# Patient Record
Sex: Female | Born: 1965 | Race: White | Hispanic: No | Marital: Single | Smoking: Current some day smoker
Health system: Southern US, Community
[De-identification: ages and names within clinical notes are randomized; demographics above are authoritative.]

## PROBLEM LIST (undated history)

## (undated) DIAGNOSIS — J439 Emphysema, unspecified: Secondary | ICD-10-CM

## (undated) DIAGNOSIS — G43909 Migraine, unspecified, not intractable, without status migrainosus: Secondary | ICD-10-CM

## (undated) DIAGNOSIS — J449 Chronic obstructive pulmonary disease, unspecified: Secondary | ICD-10-CM

## (undated) DIAGNOSIS — C801 Malignant (primary) neoplasm, unspecified: Secondary | ICD-10-CM

## (undated) DIAGNOSIS — F4310 Post-traumatic stress disorder, unspecified: Secondary | ICD-10-CM

## (undated) DIAGNOSIS — I7090 Unspecified atherosclerosis: Secondary | ICD-10-CM

## (undated) HISTORY — PX: OTHER SURGICAL HISTORY: SHX169

## (undated) HISTORY — PX: TUBAL LIGATION: SHX77

## (undated) HISTORY — DX: Chronic obstructive pulmonary disease, unspecified: J44.9

---

## 2002-08-04 ENCOUNTER — Encounter: Admission: RE | Admit: 2002-08-04 | Discharge: 2002-08-04 | Payer: Self-pay | Admitting: Internal Medicine

## 2002-08-04 ENCOUNTER — Encounter: Payer: Self-pay | Admitting: Internal Medicine

## 2004-02-07 ENCOUNTER — Other Ambulatory Visit: Payer: Self-pay

## 2004-02-12 ENCOUNTER — Other Ambulatory Visit: Payer: Self-pay

## 2004-05-17 ENCOUNTER — Emergency Department: Payer: Self-pay | Admitting: Emergency Medicine

## 2005-08-07 ENCOUNTER — Other Ambulatory Visit: Payer: Self-pay

## 2005-08-07 ENCOUNTER — Emergency Department: Payer: Self-pay | Admitting: Emergency Medicine

## 2005-12-10 ENCOUNTER — Emergency Department: Payer: Self-pay | Admitting: Emergency Medicine

## 2006-06-20 ENCOUNTER — Emergency Department: Payer: Self-pay | Admitting: Emergency Medicine

## 2007-07-13 ENCOUNTER — Emergency Department: Payer: Self-pay | Admitting: Emergency Medicine

## 2008-04-22 ENCOUNTER — Emergency Department: Payer: Self-pay | Admitting: Emergency Medicine

## 2008-04-22 ENCOUNTER — Other Ambulatory Visit: Payer: Self-pay

## 2009-02-27 ENCOUNTER — Emergency Department: Payer: Self-pay | Admitting: Emergency Medicine

## 2010-02-28 ENCOUNTER — Emergency Department (HOSPITAL_COMMUNITY): Admission: EM | Admit: 2010-02-28 | Discharge: 2010-03-01 | Payer: Self-pay | Admitting: Emergency Medicine

## 2011-11-20 ENCOUNTER — Emergency Department: Payer: Self-pay | Admitting: *Deleted

## 2011-11-20 LAB — CBC
HCT: 40.4 % (ref 35.0–47.0)
HGB: 14 g/dL (ref 12.0–16.0)
MCH: 31.2 pg (ref 26.0–34.0)
MCHC: 34.7 g/dL (ref 32.0–36.0)
MCV: 90 fL (ref 80–100)
Platelet: 216 10*3/uL (ref 150–440)
RBC: 4.49 10*6/uL (ref 3.80–5.20)
RDW: 13.5 % (ref 11.5–14.5)
WBC: 9.1 10*3/uL (ref 3.6–11.0)

## 2011-11-20 LAB — COMPREHENSIVE METABOLIC PANEL
Albumin: 3.8 g/dL (ref 3.4–5.0)
Alkaline Phosphatase: 77 U/L (ref 50–136)
Anion Gap: 13 (ref 7–16)
BUN: 12 mg/dL (ref 7–18)
Bilirubin,Total: 0.2 mg/dL (ref 0.2–1.0)
Calcium, Total: 8.9 mg/dL (ref 8.5–10.1)
Chloride: 108 mmol/L — ABNORMAL HIGH (ref 98–107)
Co2: 22 mmol/L (ref 21–32)
Creatinine: 0.79 mg/dL (ref 0.60–1.30)
EGFR (African American): >60
EGFR (Non-African Amer.): >60
Glucose: 95 mg/dL (ref 65–99)
Osmolality: 285 (ref 275–301)
Potassium: 3.5 mmol/L (ref 3.5–5.1)
SGOT(AST): 16 U/L (ref 15–37)
SGPT (ALT): 17 U/L
Sodium: 143 mmol/L (ref 136–145)
Total Protein: 7.5 g/dL (ref 6.4–8.2)

## 2011-11-20 LAB — TROPONIN I: Troponin-I: <0.02 ng/mL

## 2014-06-23 ENCOUNTER — Emergency Department: Payer: Self-pay | Admitting: Emergency Medicine

## 2014-06-23 LAB — BASIC METABOLIC PANEL
Anion Gap: 5 — ABNORMAL LOW (ref 7–16)
BUN: 12 mg/dL (ref 7–18)
Calcium, Total: 9.1 mg/dL (ref 8.5–10.1)
Chloride: 104 mmol/L (ref 98–107)
Co2: 29 mmol/L (ref 21–32)
Creatinine: 0.89 mg/dL (ref 0.60–1.30)
Glucose: 76 mg/dL (ref 65–99)
Osmolality: 274 (ref 275–301)
Potassium: 4.5 mmol/L (ref 3.5–5.1)
Sodium: 138 mmol/L (ref 136–145)

## 2014-06-23 LAB — CBC
HCT: 44.1 % (ref 35.0–47.0)
HGB: 14.6 g/dL (ref 12.0–16.0)
MCH: 31.1 pg (ref 26.0–34.0)
MCHC: 33.2 g/dL (ref 32.0–36.0)
MCV: 94 fL (ref 80–100)
Platelet: 248 10*3/uL (ref 150–440)
RBC: 4.71 10*6/uL (ref 3.80–5.20)
RDW: 12.6 % (ref 11.5–14.5)
WBC: 11.2 10*3/uL — ABNORMAL HIGH (ref 3.6–11.0)

## 2014-06-23 LAB — TROPONIN I: Troponin-I: <0.02 ng/mL

## 2014-10-17 ENCOUNTER — Emergency Department: Payer: Self-pay | Admitting: Internal Medicine

## 2015-01-14 ENCOUNTER — Other Ambulatory Visit: Payer: Self-pay

## 2015-01-14 ENCOUNTER — Encounter: Payer: Self-pay | Admitting: Emergency Medicine

## 2015-01-14 DIAGNOSIS — A5901 Trichomonal vulvovaginitis: Secondary | ICD-10-CM | POA: Insufficient documentation

## 2015-01-14 DIAGNOSIS — Z720 Tobacco use: Secondary | ICD-10-CM | POA: Insufficient documentation

## 2015-01-14 DIAGNOSIS — R079 Chest pain, unspecified: Secondary | ICD-10-CM | POA: Insufficient documentation

## 2015-01-14 DIAGNOSIS — J441 Chronic obstructive pulmonary disease with (acute) exacerbation: Secondary | ICD-10-CM | POA: Insufficient documentation

## 2015-01-14 NOTE — ED Notes (Signed)
Patient states that she has copd and has been feeling short of breath and chest pain that started this morning.

## 2015-01-15 ENCOUNTER — Emergency Department: Payer: Self-pay

## 2015-01-15 ENCOUNTER — Encounter: Payer: Self-pay | Admitting: Emergency Medicine

## 2015-01-15 ENCOUNTER — Emergency Department
Admission: EM | Admit: 2015-01-15 | Discharge: 2015-01-15 | Disposition: A | Discharge status: Home / Self Care | Payer: Self-pay | Attending: Student | Admitting: Student

## 2015-01-15 DIAGNOSIS — R0602 Shortness of breath: Secondary | ICD-10-CM

## 2015-01-15 DIAGNOSIS — J40 Bronchitis, not specified as acute or chronic: Secondary | ICD-10-CM

## 2015-01-15 DIAGNOSIS — A599 Trichomoniasis, unspecified: Secondary | ICD-10-CM

## 2015-01-15 DIAGNOSIS — N898 Other specified noninflammatory disorders of vagina: Secondary | ICD-10-CM

## 2015-01-15 HISTORY — DX: Malignant (primary) neoplasm, unspecified: C80.1

## 2015-01-15 HISTORY — DX: Migraine, unspecified, not intractable, without status migrainosus: G43.909

## 2015-01-15 HISTORY — DX: Unspecified atherosclerosis: I70.90

## 2015-01-15 HISTORY — DX: Chronic obstructive pulmonary disease, unspecified: J44.9

## 2015-01-15 HISTORY — DX: Post-traumatic stress disorder, unspecified: F43.10

## 2015-01-15 LAB — CHLAMYDIA/NGC RT PCR (ARMC ONLY)
CHLAMYDIA TR: NOT DETECTED
N GONORRHOEAE: NOT DETECTED

## 2015-01-15 LAB — BASIC METABOLIC PANEL
ANION GAP: 11 (ref 5–15)
BUN: 23 mg/dL — AB (ref 6–20)
CALCIUM: 9.4 mg/dL (ref 8.9–10.3)
CHLORIDE: 107 mmol/L (ref 101–111)
CO2: 25 mmol/L (ref 22–32)
Creatinine, Ser: 0.96 mg/dL (ref 0.44–1.00)
GFR calc Af Amer: >60 mL/min (ref >60)
GFR calc non Af Amer: >60 mL/min (ref >60)
GLUCOSE: 100 mg/dL — AB (ref 65–99)
POTASSIUM: 3.7 mmol/L (ref 3.5–5.1)
SODIUM: 143 mmol/L (ref 135–145)

## 2015-01-15 LAB — CBC
HCT: 39.1 % (ref 35.0–47.0)
HEMOGLOBIN: 13.1 g/dL (ref 12.0–16.0)
MCH: 30.6 pg (ref 26.0–34.0)
MCHC: 33.6 g/dL (ref 32.0–36.0)
MCV: 91.1 fL (ref 80.0–100.0)
Platelets: 224 10*3/uL (ref 150–440)
RBC: 4.29 MIL/uL (ref 3.80–5.20)
RDW: 13.9 % (ref 11.5–14.5)
WBC: 8.8 10*3/uL (ref 3.6–11.0)

## 2015-01-15 LAB — WET PREP, GENITAL
Clue Cells Wet Prep HPF POC: NONE SEEN
Yeast Wet Prep HPF POC: NONE SEEN

## 2015-01-15 LAB — TROPONIN I: Troponin I: <0.03 ng/mL (ref <0.031)

## 2015-01-15 LAB — BRAIN NATRIURETIC PEPTIDE: B Natriuretic Peptide: 79 pg/mL (ref 0.0–100.0)

## 2015-01-15 MED ORDER — PREDNISONE 20 MG PO TABS: 60.0000 mg | ORAL_TABLET | Freq: Every day | ORAL | Status: DC

## 2015-01-15 MED ORDER — PREDNISONE 20 MG PO TABS
ORAL_TABLET | ORAL | Status: AC
  Administered 2015-01-15: 60 mg via ORAL
  Filled 2015-01-15: qty 3

## 2015-01-15 MED ORDER — METRONIDAZOLE 500 MG PO TABS
2000.0000 mg | ORAL_TABLET | Freq: Once | ORAL | Status: AC
  Administered 2015-01-15: 2000 mg via ORAL

## 2015-01-15 MED ORDER — AZITHROMYCIN 1 G PO PACK
1.0000 g | PACK | Freq: Once | ORAL | Status: AC
  Administered 2015-01-15: 1 g via ORAL

## 2015-01-15 MED ORDER — CEFTRIAXONE SODIUM 250 MG IJ SOLR
INTRAMUSCULAR | Status: AC
  Administered 2015-01-15: 250 mg via INTRAMUSCULAR
  Filled 2015-01-15: qty 250

## 2015-01-15 MED ORDER — AZITHROMYCIN 1 G PO PACK
PACK | ORAL | Status: AC
  Administered 2015-01-15: 1 g via ORAL
  Filled 2015-01-15: qty 1

## 2015-01-15 MED ORDER — CEFTRIAXONE SODIUM 250 MG IJ SOLR
250.0000 mg | Freq: Once | INTRAMUSCULAR | Status: AC
  Administered 2015-01-15: 250 mg via INTRAMUSCULAR

## 2015-01-15 MED ORDER — PREDNISONE 20 MG PO TABS
60.0000 mg | ORAL_TABLET | Freq: Once | ORAL | Status: AC
  Administered 2015-01-15: 60 mg via ORAL

## 2015-01-15 MED ORDER — ALBUTEROL SULFATE HFA 108 (90 BASE) MCG/ACT IN AERS: 2.0000 | INHALATION_SPRAY | Freq: Four times a day (QID) | RESPIRATORY_TRACT | Status: DC | PRN

## 2015-01-15 MED ORDER — IPRATROPIUM-ALBUTEROL 0.5-2.5 (3) MG/3ML IN SOLN
3.0000 mL | Freq: Once | RESPIRATORY_TRACT | Status: AC
  Administered 2015-01-15: 3 mL via RESPIRATORY_TRACT

## 2015-01-15 MED ORDER — METRONIDAZOLE 500 MG PO TABS
ORAL_TABLET | ORAL | Status: AC
  Administered 2015-01-15: 2000 mg via ORAL
  Filled 2015-01-15: qty 4

## 2015-01-15 NOTE — ED Notes (Signed)
Patient with no complaints at this time. Respirations even and unlabored. Skin warm/dry. Discharge instructions reviewed with patient at this time. Patient given opportunity to voice concerns/ask questions. Patient discharged at this time and left Emergency Department with steady gait.   

## 2015-01-15 NOTE — ED Provider Notes (Signed)
[facility] Emergency Department Provider Note  ____________________________________________  Time seen: Approximately 2:10 AM  I have reviewed the triage vital signs and the nursing notes.   HISTORY  Chief Complaint Shortness of Breath and Chest Pain    HPI Sandra West is a 49 y.o. female with history of COPD and hepatitis presents for evaluation of shortness of breath and cough, gradual onset, constant since yesterday. Today, she began to have shortness of breath which she reports is consistent with her usual COPD flares. She has chest pain with cough, it is not pleuritic or associated with exertion. Cough is unproductive. Current severity is moderate. No fevers. Has used her albuterol inhaler at home which has helped her symptoms somewhat. No abdominal pain, no vomiting or diarrhea. She does report 2 weeks of foul-smelling yellowish-green vaginal discharge and she is concerned for sexual transmitted infection given that she  recently started having sex with a new partner.   Past Medical History  Diagnosis Date  . COPD (chronic obstructive pulmonary disease)   . Blocked artery     Left leg  . Migraines   . PTSD (post-traumatic stress disorder)   . Cancer     Cervical    There are no active problems to display for this patient.   Past Surgical History  Procedure Laterality Date  . Tubal ligation      No current outpatient prescriptions on file.  Allergies Review of patient's allergies indicates no known allergies.  History reviewed. No pertinent family history.  Social History History  Substance Use Topics  . Smoking status: Current Every Day Smoker -- 0.50 packs/day for 41 years    Types: Cigarettes  . Smokeless tobacco: Not on file  . Alcohol Use: No    Review of Systems Constitutional: No fever/chills Eyes: No visual changes. ENT: No sore throat. Cardiovascular: + chest pain with cough. Respiratory: + shortness of  breath. Gastrointestinal: No abdominal pain.  No nausea, no vomiting.  No diarrhea.  No constipation. Genitourinary: Negative for dysuria. Musculoskeletal: Negative for back pain. Skin: Negative for rash. Neurological: Negative for headaches, focal weakness or numbness.  10-point ROS otherwise negative.  ____________________________________________   PHYSICAL EXAM:  VITAL SIGNS: ED Triage Vitals  Enc Vitals Group     BP 01/14/15 2354 110/76 mmHg     Pulse Rate 01/14/15 2354 80     Resp 01/14/15 2354 24     Temp 01/14/15 2354 97.8 F (36.6 C)     Temp Source 01/14/15 2354 Oral     SpO2 01/14/15 2354 98 %     Weight 01/14/15 2354 138 lb (62.596 kg)     Height 01/14/15 2354 5\' 6"  (1.676 m)     Head Cir --      Peak Flow --      Pain Score 01/14/15 2354 7     Pain Loc --      Pain Edu? --      Excl. in [place]? --     Constitutional: Alert and oriented. Well appearing and in no acute distress. Sitting up in bed playing a game on her phone. +Frequent cough Eyes: Conjunctivae are normal. PERRL. EOMI. Head: Atraumatic. Nose: No congestion/rhinnorhea. Mouth/Throat: Mucous membranes are moist.  Oropharynx non-erythematous. Neck: No stridor.  Cardiovascular: Normal rate, regular rhythm. Grossly normal heart sounds.  Good peripheral circulation. Respiratory: Normal respiratory effort.  No retractions. Lungs CTAB. Gastrointestinal: Soft and nontender. No distention. No abdominal bruits. No CVA tenderness. Pelvic: Thick yellow  mucopurulent discharge from a closed os, no cervical motion tenderness, no bimanual tenderness Musculoskeletal: No lower extremity tenderness nor edema.  No joint effusions. Neurologic:  Normal speech and language. No gross focal neurologic deficits are appreciated. Speech is normal. No gait instability. Skin:  Skin is warm, dry and intact. No rash noted. Psychiatric: Mood and affect are normal. Speech and behavior are  normal.  ____________________________________________   LABS (all labs ordered are listed, but only abnormal results are displayed)  Labs Reviewed  WET PREP, GENITAL - Abnormal; Notable for the following:    Trich, Wet Prep MODERATE (*)    WBC, Wet Prep HPF POC MANY (*)    All other components within normal limits  BASIC METABOLIC PANEL - Abnormal; Notable for the following:    Glucose, Bld 100 (*)    BUN 23 (*)    All other components within normal limits  CHLAMYDIA/NGC RT PCR ([facility] ONLY)  CBC  BRAIN NATRIURETIC PEPTIDE  TROPONIN I   ____________________________________________  EKG  ED ECG REPORT I, Joanne Gavel, the attending physician, personally viewed and interpreted this ECG.   Date: 01/15/2015  EKG Time: 23:59  Rate: 76  Rhythm: normal EKG, normal sinus rhythm  Axis: Normal  Intervals:none, normal intervals  ST&T Change: No acute ST segment change  ____________________________________________  RADIOLOGY  CXR  IMPRESSION: Hyperinflation consistent with COPD. No acute cardiopulmonary findings. ____________________________________________   PROCEDURES  Procedure(s) performed: None  Critical Care performed: No  ____________________________________________   INITIAL IMPRESSION / ASSESSMENT AND PLAN / ED COURSE  Pertinent labs & imaging results that were available during my care of the patient were reviewed by me and considered in my medical decision making (see chart for details).  Sandra West is a 49 y.o. female with history of COPD and hepatitis presents for evaluation of shortness of breath and cough, gradual onset, constant since yesterday. On exam, she is very well-appearing and in no acute distress, sitting up in bed playing a game on her phone. No increased work of breathing, no hypoxia. She does have frequent cough rate suspect mild COPD exacerbation versus bronchitis. Chest x-ray negative for any evidence of pneumonia.  We'll discharge with steroids, albuterol. She is having chest pain with cough and I suspect musculoskeletal chest pain. EKG reassuring, troponin negative, not consistent with ACS, PE or acute aortic dissection. Additionally, she is complaining of abnormal vaginal discharge and given the appearance of discharge on exam and +trichomonas, we'll treat for all sexually transmitted infections with IM ceftriaxone, PO azithro, PO flagyl. We'll DC home with close PCP follow-up, return precautions and she is comfortable with the discharge plan. ____________________________________________   FINAL CLINICAL IMPRESSION(S) / ED DIAGNOSES  Final diagnoses:  SOB (shortness of breath)  Bronchitis  Vaginal discharge  Trichomonal infection      Joanne Gavel, MD 01/15/15 [phone]

## 2015-01-18 ENCOUNTER — Telehealth: Payer: Self-pay | Admitting: Emergency Medicine

## 2015-01-18 NOTE — ED Notes (Signed)
Pt called asking for results of all the tests that were done while she was herer.  I explained that she had several tests done, but she did not have specific question.  Says she has teh mychart and just does not understand the tests.  I explained that basic labs including electrolytes, heart tests, tests looking for anemia, infection were done as well as the gc chlamydia tests.  I told her that would have been the only one the ed md would not have had the result for while she was here.  She says she is going to med management and [facility].

## 2016-04-28 ENCOUNTER — Emergency Department
Admission: EM | Admit: 2016-04-28 | Discharge: 2016-04-28 | Disposition: A | Discharge status: Home / Self Care | Payer: Self-pay | Attending: Emergency Medicine | Admitting: Emergency Medicine

## 2016-04-28 ENCOUNTER — Emergency Department: Payer: Self-pay

## 2016-04-28 ENCOUNTER — Encounter: Payer: Self-pay | Admitting: Emergency Medicine

## 2016-04-28 DIAGNOSIS — J449 Chronic obstructive pulmonary disease, unspecified: Secondary | ICD-10-CM | POA: Insufficient documentation

## 2016-04-28 DIAGNOSIS — Y990 Civilian activity done for income or pay: Secondary | ICD-10-CM | POA: Insufficient documentation

## 2016-04-28 DIAGNOSIS — X500XXA Overexertion from strenuous movement or load, initial encounter: Secondary | ICD-10-CM | POA: Insufficient documentation

## 2016-04-28 DIAGNOSIS — Y9269 Other specified industrial and construction area as the place of occurrence of the external cause: Secondary | ICD-10-CM | POA: Insufficient documentation

## 2016-04-28 DIAGNOSIS — F17210 Nicotine dependence, cigarettes, uncomplicated: Secondary | ICD-10-CM | POA: Insufficient documentation

## 2016-04-28 DIAGNOSIS — S39012A Strain of muscle, fascia and tendon of lower back, initial encounter: Secondary | ICD-10-CM | POA: Insufficient documentation

## 2016-04-28 DIAGNOSIS — Y9389 Activity, other specified: Secondary | ICD-10-CM | POA: Insufficient documentation

## 2016-04-28 DIAGNOSIS — Z8541 Personal history of malignant neoplasm of cervix uteri: Secondary | ICD-10-CM | POA: Insufficient documentation

## 2016-04-28 MED ORDER — CYCLOBENZAPRINE HCL 10 MG PO TABS: 10.0000 mg | ORAL_TABLET | Freq: Three times a day (TID) | ORAL | 0 refills | Status: DC | PRN

## 2016-04-28 MED ORDER — TRAMADOL HCL 50 MG PO TABS: 50.0000 mg | ORAL_TABLET | Freq: Four times a day (QID) | ORAL | 0 refills | Status: AC | PRN

## 2016-04-28 MED ORDER — HYDROMORPHONE HCL 1 MG/ML IJ SOLN
1.0000 mg | Freq: Once | INTRAMUSCULAR | Status: AC
  Administered 2016-04-28: 1 mg via INTRAMUSCULAR
  Filled 2016-04-28: qty 1

## 2016-04-28 MED ORDER — KETOROLAC TROMETHAMINE 60 MG/2ML IM SOLN
30.0000 mg | Freq: Once | INTRAMUSCULAR | Status: AC
  Administered 2016-04-28: 30 mg via INTRAMUSCULAR
  Filled 2016-04-28: qty 2

## 2016-04-28 NOTE — ED Triage Notes (Signed)
Low back pain radiating to R leg x 1 week, states does a lot of lifting at work.

## 2016-04-28 NOTE — ED Provider Notes (Signed)
[facility] Emergency Department Provider Note   ____________________________________________   None    (approximate)  I have reviewed the triage vital signs and the nursing notes.   HISTORY  Chief Complaint Back Pain    HPI Sandra West is a 50 y.o. female patient complain of 1 week of increasing low back pain with radicular component to the right lower extremity. Patient denies any bladder or bowel dysfunction. Patient state no provocative incident but admits she does a lot of lifting at work. No palliative measures taken for this complaint. Patient rates the pain as a 10 over 10. Patient described a pain as sharp.   Past Medical History:  Diagnosis Date  . Blocked artery (HCC)    Left leg  . Cancer (HCC)    Cervical  . COPD (chronic obstructive pulmonary disease) ([place])   . Migraines   . PTSD (post-traumatic stress disorder)     There are no active problems to display for this patient.   Past Surgical History:  Procedure Laterality Date  . TUBAL LIGATION      Prior to Admission medications   Medication Sig Start Date End Date Taking? Authorizing Provider  albuterol (PROVENTIL HFA;VENTOLIN HFA) 108 (90 BASE) MCG/ACT inhaler Inhale 2 puffs into the lungs every 6 (six) hours as needed for wheezing or shortness of breath. 01/15/15   Joanne Gavel, MD  cyclobenzaprine (FLEXERIL) 10 MG tablet Take 1 tablet (10 mg total) by mouth 3 (three) times daily as needed. 04/28/16   Sable Feil, PA-C  predniSONE (DELTASONE) 20 MG tablet Take 3 tablets (60 mg total) by mouth daily. 01/15/15   Joanne Gavel, MD  traMADol (ULTRAM) 50 MG tablet Take 1 tablet (50 mg total) by mouth every 6 (six) hours as needed. 04/28/16 04/28/17  Sable Feil, PA-C    Allergies Review of patient's allergies indicates no known allergies.  No family history on file.  Social History Social History  Substance Use Topics  . Smoking status: Current Every Day Smoker   Packs/day: 1.00    Years: 41.00    Types: Cigarettes  . Smokeless tobacco: Not on file  . Alcohol use No    Review of Systems Constitutional: No fever/chills Eyes: No visual changes. ENT: No sore throat. Cardiovascular: Denies chest pain. Respiratory: Denies shortness of breath. Gastrointestinal: No abdominal pain.  No nausea, no vomiting.  No diarrhea.  No constipation. Genitourinary: Negative for dysuria. Musculoskeletal: Positive for back pain. Skin: Negative for rash. Neurological: Negative for headaches, focal weakness or numbness. Psychiatric:Posttraumatic stress syndrome .  ____________________________________________   PHYSICAL EXAM:  VITAL SIGNS: ED Triage Vitals  Enc Vitals Group     BP 04/28/16 0932 107/85     Pulse Rate 04/28/16 0932 92     Resp 04/28/16 0932 20     Temp 04/28/16 0932 97.7 F (36.5 C)     Temp Source 04/28/16 0932 Oral     SpO2 04/28/16 0932 98 %     Weight 04/28/16 0933 145 lb (65.8 kg)     Height 04/28/16 0933 5\' 6"  (1.676 m)     Head Circumference --      Peak Flow --      Pain Score 04/28/16 0933 10     Pain Loc --      Pain Edu? --      Excl. in [place]? --     Constitutional: Alert and oriented. Moderate distress Eyes: Conjunctivae are normal. PERRL. EOMI.  Head: Atraumatic. Nose: No congestion/rhinnorhea. Mouth/Throat: Mucous membranes are moist.  Oropharynx non-erythematous. Neck: No stridor.  No cervical spine tenderness to palpation. Hematological/Lymphatic/Immunilogical: No cervical lymphadenopathy. Cardiovascular: Normal rate, regular rhythm. Grossly normal heart sounds.  Good peripheral circulation. Respiratory: Normal respiratory effort.  No retractions. Lungs CTAB. Gastrointestinal: Soft and nontender. No distention. No abdominal bruits. No CVA tenderness. Musculoskeletal: No lower extremity tenderness nor edema.  No joint effusions. Neurologic:  Normal speech and language. No gross focal neurologic deficits are  appreciated. No gait instability. Skin:  Skin is warm, dry and intact. No rash noted. Psychiatric: Mood and affect are normal. Speech and behavior are normal.  ____________________________________________   LABS (all labs ordered are listed, but only abnormal results are displayed)  Labs Reviewed - No data to display ____________________________________________  EKG   ____________________________________________  RADIOLOGY  No acute findings x-ray of the lumbar spine. ____________________________________________   PROCEDURES  Procedure(s) performed: None  Procedures  Critical Care performed: No  ____________________________________________   INITIAL IMPRESSION / ASSESSMENT AND PLAN / ED COURSE  Pertinent labs & imaging results that were available during my care of the patient were reviewed by me and considered in my medical decision making (see chart for details).  Lumbosacral strain. Discussed negative x-ray finding with patient. Patient given discharge Instructions. [facility]  Clinical Course     ____________________________________________   FINAL CLINICAL IMPRESSION(S) / ED DIAGNOSES  Final diagnoses:  Low back strain, initial encounter      NEW MEDICATIONS STARTED DURING THIS VISIT:  New Prescriptions   CYCLOBENZAPRINE (FLEXERIL) 10 MG TABLET    Take 1 tablet (10 mg total) by mouth 3 (three) times daily as needed.   TRAMADOL (ULTRAM) 50 MG TABLET    Take 1 tablet (50 mg total) by mouth every 6 (six) hours as needed.     Note:  This document was prepared using Dragon voice recognition software and may include unintentional dictation errors.    Sable Feil, PA-C 04/28/16 [place], MD 04/28/16 1600

## 2017-01-30 ENCOUNTER — Encounter: Payer: Self-pay | Admitting: *Deleted

## 2017-01-30 ENCOUNTER — Emergency Department
Admission: EM | Admit: 2017-01-30 | Discharge: 2017-01-30 | Disposition: A | Discharge status: Home / Self Care | Payer: Self-pay | Attending: Student in an Organized Health Care Education/Training Program | Admitting: Student in an Organized Health Care Education/Training Program

## 2017-01-30 DIAGNOSIS — W260XXA Contact with knife, initial encounter: Secondary | ICD-10-CM | POA: Insufficient documentation

## 2017-01-30 DIAGNOSIS — J449 Chronic obstructive pulmonary disease, unspecified: Secondary | ICD-10-CM | POA: Insufficient documentation

## 2017-01-30 DIAGNOSIS — Y999 Unspecified external cause status: Secondary | ICD-10-CM | POA: Insufficient documentation

## 2017-01-30 DIAGNOSIS — S61432A Puncture wound without foreign body of left hand, initial encounter: Secondary | ICD-10-CM | POA: Insufficient documentation

## 2017-01-30 DIAGNOSIS — Y92010 Kitchen of single-family (private) house as the place of occurrence of the external cause: Secondary | ICD-10-CM | POA: Insufficient documentation

## 2017-01-30 DIAGNOSIS — Y93G1 Activity, food preparation and clean up: Secondary | ICD-10-CM | POA: Insufficient documentation

## 2017-01-30 DIAGNOSIS — Z79899 Other long term (current) drug therapy: Secondary | ICD-10-CM | POA: Insufficient documentation

## 2017-01-30 DIAGNOSIS — F17210 Nicotine dependence, cigarettes, uncomplicated: Secondary | ICD-10-CM | POA: Insufficient documentation

## 2017-01-30 DIAGNOSIS — Z23 Encounter for immunization: Secondary | ICD-10-CM | POA: Insufficient documentation

## 2017-01-30 MED ORDER — TETANUS-DIPHTH-ACELL PERTUSSIS 5-2.5-18.5 LF-MCG/0.5 IM SUSP
0.5000 mL | Freq: Once | INTRAMUSCULAR | Status: AC
  Administered 2017-01-30: 0.5 mL via INTRAMUSCULAR
  Filled 2017-01-30: qty 0.5

## 2017-01-30 MED ORDER — TRAMADOL HCL 50 MG PO TABS: 50.0000 mg | ORAL_TABLET | Freq: Four times a day (QID) | ORAL | 0 refills | Status: DC | PRN

## 2017-01-30 MED ORDER — NAPROXEN 500 MG PO TABS
500.0000 mg | ORAL_TABLET | Freq: Once | ORAL | Status: AC
  Administered 2017-01-30: 500 mg via ORAL
  Filled 2017-01-30: qty 1

## 2017-01-30 NOTE — ED Provider Notes (Signed)
[facility] Emergency Department Provider Note   ____________________________________________   First MD Initiated Contact with Patient 01/30/17 1120     (approximate)  I have reviewed the triage vital signs and the nursing notes.   HISTORY  Chief Complaint Laceration    HPI Sandra West is a 51 y.o. female patient complaining of pain to left hand secondary to a puncture wound. Patient was trying to separate frozen meat when she stepped her left hand with a knife. Patient state bleeding is controlled direct pressure. Patient concerned because of loss of sensation to the finger. Patient tetanus shot is not up-to-date. Patient states since arrival to the ED sensation has returned back to her finger. Patient is right-hand dominant.  Past Medical History:  Diagnosis Date  . Blocked artery    Left leg  . Cancer (HCC)    Cervical  . COPD (chronic obstructive pulmonary disease) ([place])   . Migraines   . PTSD (post-traumatic stress disorder)     There are no active problems to display for this patient.   Past Surgical History:  Procedure Laterality Date  . TUBAL LIGATION      Prior to Admission medications   Medication Sig Start Date End Date Taking? Authorizing Provider  albuterol (PROVENTIL HFA;VENTOLIN HFA) 108 (90 BASE) MCG/ACT inhaler Inhale 2 puffs into the lungs every 6 (six) hours as needed for wheezing or shortness of breath. 01/15/15   Joanne Gavel, MD  cyclobenzaprine (FLEXERIL) 10 MG tablet Take 1 tablet (10 mg total) by mouth 3 (three) times daily as needed. 04/28/16   Sable Feil, PA-C  predniSONE (DELTASONE) 20 MG tablet Take 3 tablets (60 mg total) by mouth daily. 01/15/15   Joanne Gavel, MD  traMADol (ULTRAM) 50 MG tablet Take 1 tablet (50 mg total) by mouth every 6 (six) hours as needed. 04/28/16 04/28/17  Sable Feil, PA-C  traMADol (ULTRAM) 50 MG tablet Take 1 tablet (50 mg total) by mouth every 6 (six) hours as needed for  moderate pain. 01/30/17   Sable Feil, PA-C    Allergies Patient has no known allergies.  History reviewed. No pertinent family history.  Social History Social History  Substance Use Topics  . Smoking status: Current Every Day Smoker    Packs/day: 1.00    Years: 41.00    Types: Cigarettes  . Smokeless tobacco: Not on file  . Alcohol use No    Review of Systems  Constitutional: No fever/chills Eyes: No visual changes. ENT: No sore throat. Cardiovascular: Denies chest pain. Respiratory: Denies shortness of breath. Gastrointestinal: No abdominal pain.  No nausea, no vomiting.  No diarrhea.  No constipation. Genitourinary: Negative for dysuria. Musculoskeletal: Negative for back pain. Skin: Negative for rash. Neurological: Negative for headaches, focal weakness or numbness. ____________________________________________   PHYSICAL EXAM:  VITAL SIGNS: ED Triage Vitals  Enc Vitals Group     BP 01/30/17 1033 (!) 116/56     Pulse Rate 01/30/17 1033 85     Resp 01/30/17 1033 16     Temp 01/30/17 1033 97.5 F (36.4 C)     Temp Source 01/30/17 1033 Oral     SpO2 01/30/17 1033 100 %     Weight 01/30/17 1032 156 lb (70.8 kg)     Height 01/30/17 1032 5\' 6"  (1.676 m)     Head Circumference --      Peak Flow --      Pain Score 01/30/17 1032 8  Pain Loc --      Pain Edu? --      Excl. in [place]? --     Constitutional: Alert and oriented. Well appearing and in no acute distress. Eyes: Conjunctivae are normal. PERRL. EOMI. Head: Atraumatic. Nose: No congestion/rhinnorhea. Mouth/Throat: Mucous membranes are moist.  Oropharynx non-erythematous. Neck: No stridor.  No cervical spine tenderness to palpation. Hematological/Lymphatic/Immunilogical: No cervical lymphadenopathy. Cardiovascular: Normal rate, regular rhythm. Grossly normal heart sounds.  Good peripheral circulation. Respiratory: Normal respiratory effort.  No retractions. Lungs CTAB. Gastrointestinal: Soft and  nontender. No distention. No abdominal bruits. No CVA tenderness. Musculoskeletal: No lower extremity tenderness nor edema.  No joint effusions. Neurologic:  Normal speech and language. No gross focal neurologic deficits are appreciated. No gait instability. Skin:  Skin is warm, dry and intact. No rash noted. Psychiatric: Mood and affect are normal. Speech and behavior are normal.  ____________________________________________   LABS (all labs ordered are listed, but only abnormal results are displayed)  Labs Reviewed - No data to display ____________________________________________  EKG   ____________________________________________  RADIOLOGY  No results found.  ____________________________________________   PROCEDURES  Procedure(s) performed: LACERATION REPAIR Performed by: Sable Feil Authorized by: Sable Feil Consent: Verbal consent obtained. Risks and benefits: risks, benefits and alternatives were discussed Consent given by: patient Patient identity confirmed: provided demographic data Prepped and Draped in normal sterile fashion Wound explored  Laceration Location: [place] aspect the left hand  Laceration Length: 0.2cm  No Foreign Bodies seen or palpated  Anesthesia: local infiltration  Irrigation method: syringe Amount of cleaning: standard  Skin closure: Dermabond Patient tolerance: Patient tolerated the procedure well with no immediate complications.   Procedures  Critical Care performed: No  ____________________________________________   INITIAL IMPRESSION / ASSESSMENT AND PLAN / ED COURSE  Pertinent labs & imaging results that were available during my care of the patient were reviewed by me and considered in my medical decision making (see chart for details).  Puncture wound left hand. Patient given discharge care instructions. Patient given tetanus shot prior to departure.       ____________________________________________   FINAL CLINICAL IMPRESSION(S) / ED DIAGNOSES  Final diagnoses:  Puncture wound of left hand without foreign body, initial encounter      NEW MEDICATIONS STARTED DURING THIS VISIT:  New Prescriptions   TRAMADOL (ULTRAM) 50 MG TABLET    Take 1 tablet (50 mg total) by mouth every 6 (six) hours as needed for moderate pain.     Note:  This document was prepared using Dragon voice recognition software and may include unintentional dictation errors.    Sable Feil, PA-C 01/30/17 1138    Merlyn Lot, MD 01/30/17 1213

## 2017-01-30 NOTE — ED Triage Notes (Signed)
States she was cooking and accidentally stabbed her left hand with knife, no bleeding at this time but states pain
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
## 2017-01-30 NOTE — Discharge Instructions (Signed)
Wear splint for 1-2 days as needed.

## 2018-10-14 ENCOUNTER — Encounter: Payer: Self-pay | Admitting: Emergency Medicine

## 2018-10-14 ENCOUNTER — Other Ambulatory Visit: Payer: Self-pay

## 2018-10-14 ENCOUNTER — Emergency Department
Admission: EM | Admit: 2018-10-14 | Discharge: 2018-10-14 | Disposition: A | Discharge status: Home / Self Care | Payer: Self-pay | Attending: Emergency Medicine | Admitting: Emergency Medicine

## 2018-10-14 DIAGNOSIS — Z5321 Procedure and treatment not carried out due to patient leaving prior to being seen by health care provider: Secondary | ICD-10-CM | POA: Insufficient documentation

## 2018-10-14 DIAGNOSIS — K0889 Other specified disorders of teeth and supporting structures: Secondary | ICD-10-CM | POA: Insufficient documentation

## 2018-10-14 LAB — COMPREHENSIVE METABOLIC PANEL
ALBUMIN: 4.3 g/dL (ref 3.5–5.0)
ALT: 19 U/L (ref 0–44)
ANION GAP: 8 (ref 5–15)
AST: 23 U/L (ref 15–41)
Alkaline Phosphatase: 55 U/L (ref 38–126)
BILIRUBIN TOTAL: 0.6 mg/dL (ref 0.3–1.2)
BUN: 16 mg/dL (ref 6–20)
CO2: 25 mmol/L (ref 22–32)
CREATININE: 0.78 mg/dL (ref 0.44–1.00)
Calcium: 9.1 mg/dL (ref 8.9–10.3)
Chloride: 106 mmol/L (ref 98–111)
GFR calc Af Amer: >60 mL/min (ref >60)
GFR calc non Af Amer: >60 mL/min (ref >60)
GLUCOSE: 102 mg/dL — AB (ref 70–99)
Potassium: 3.8 mmol/L (ref 3.5–5.1)
Sodium: 139 mmol/L (ref 135–145)
TOTAL PROTEIN: 7.1 g/dL (ref 6.5–8.1)

## 2018-10-14 LAB — CBC WITH DIFFERENTIAL/PLATELET
Abs Immature Granulocytes: 0.01 10*3/uL (ref 0.00–0.07)
BASOS PCT: 1 %
Basophils Absolute: 0.1 10*3/uL (ref 0.0–0.1)
EOS ABS: 0.4 10*3/uL (ref 0.0–0.5)
EOS PCT: 5 %
HEMATOCRIT: 37.2 % (ref 36.0–46.0)
Hemoglobin: 12.9 g/dL (ref 12.0–15.0)
IMMATURE GRANULOCYTES: 0 %
Lymphocytes Relative: 41 %
Lymphs Abs: 3.3 10*3/uL (ref 0.7–4.0)
MCH: 31.2 pg (ref 26.0–34.0)
MCHC: 34.7 g/dL (ref 30.0–36.0)
MCV: 90.1 fL (ref 80.0–100.0)
MONO ABS: 0.5 10*3/uL (ref 0.1–1.0)
MONOS PCT: 6 %
NEUTROS PCT: 47 %
Neutro Abs: 3.8 10*3/uL (ref 1.7–7.7)
PLATELETS: 213 10*3/uL (ref 150–400)
RBC: 4.13 MIL/uL (ref 3.87–5.11)
RDW: 12.7 % (ref 11.5–15.5)
WBC: 8.1 10*3/uL (ref 4.0–10.5)
nRBC: 0 % (ref 0.0–0.2)

## 2018-10-14 NOTE — ED Triage Notes (Signed)
Pt arrived to the ED accompanied by her significant other for complaints of dental pain secondary to having teeth pulled and having a known abscess on her tong. Pt reports that for the last month she has been going to the dentist to get teeth pulled and in the past 2 weeks she developed abscesses that she is taking antibiotics for. Pt reports that her pain is unbearable and that she does not feel well. Pt is AOx4 in no apparent distress.

## 2018-10-30 ENCOUNTER — Emergency Department: Payer: Self-pay

## 2018-10-30 ENCOUNTER — Encounter: Payer: Self-pay | Admitting: Emergency Medicine

## 2018-10-30 ENCOUNTER — Other Ambulatory Visit: Payer: Self-pay

## 2018-10-30 ENCOUNTER — Observation Stay
Admission: EM | Admit: 2018-10-30 | Discharge: 2018-10-31 | Disposition: A | Discharge status: Home / Self Care | Payer: Self-pay | Attending: Internal Medicine | Admitting: Internal Medicine

## 2018-10-30 DIAGNOSIS — F4310 Post-traumatic stress disorder, unspecified: Secondary | ICD-10-CM | POA: Insufficient documentation

## 2018-10-30 DIAGNOSIS — Z8541 Personal history of malignant neoplasm of cervix uteri: Secondary | ICD-10-CM | POA: Insufficient documentation

## 2018-10-30 DIAGNOSIS — R739 Hyperglycemia, unspecified: Secondary | ICD-10-CM | POA: Insufficient documentation

## 2018-10-30 DIAGNOSIS — Z79899 Other long term (current) drug therapy: Secondary | ICD-10-CM | POA: Insufficient documentation

## 2018-10-30 DIAGNOSIS — T380X5A Adverse effect of glucocorticoids and synthetic analogues, initial encounter: Secondary | ICD-10-CM | POA: Insufficient documentation

## 2018-10-30 DIAGNOSIS — Z23 Encounter for immunization: Secondary | ICD-10-CM | POA: Insufficient documentation

## 2018-10-30 DIAGNOSIS — Z7952 Long term (current) use of systemic steroids: Secondary | ICD-10-CM | POA: Insufficient documentation

## 2018-10-30 DIAGNOSIS — Z20828 Contact with and (suspected) exposure to other viral communicable diseases: Secondary | ICD-10-CM | POA: Insufficient documentation

## 2018-10-30 DIAGNOSIS — F17210 Nicotine dependence, cigarettes, uncomplicated: Secondary | ICD-10-CM | POA: Insufficient documentation

## 2018-10-30 DIAGNOSIS — J441 Chronic obstructive pulmonary disease with (acute) exacerbation: Principal | ICD-10-CM | POA: Insufficient documentation

## 2018-10-30 HISTORY — DX: Emphysema, unspecified: J43.9

## 2018-10-30 LAB — URINALYSIS, COMPLETE (UACMP) WITH MICROSCOPIC
BACTERIA UA: NONE SEEN
BILIRUBIN URINE: NEGATIVE
Glucose, UA: NEGATIVE mg/dL
HGB URINE DIPSTICK: NEGATIVE
Ketones, ur: NEGATIVE mg/dL
Nitrite: NEGATIVE
PROTEIN: NEGATIVE mg/dL
Specific Gravity, Urine: 1.018 (ref 1.005–1.030)
pH: 8 (ref 5.0–8.0)

## 2018-10-30 LAB — COMPREHENSIVE METABOLIC PANEL
ALT: 21 U/L (ref 0–44)
ANION GAP: 10 (ref 5–15)
AST: 24 U/L (ref 15–41)
Albumin: 4.3 g/dL (ref 3.5–5.0)
Alkaline Phosphatase: 67 U/L (ref 38–126)
BUN: 20 mg/dL (ref 6–20)
CHLORIDE: 106 mmol/L (ref 98–111)
CO2: 23 mmol/L (ref 22–32)
Calcium: 9.3 mg/dL (ref 8.9–10.3)
Creatinine, Ser: 0.67 mg/dL (ref 0.44–1.00)
GFR calc non Af Amer: >60 mL/min (ref >60)
Glucose, Bld: 138 mg/dL — ABNORMAL HIGH (ref 70–99)
POTASSIUM: 4.2 mmol/L (ref 3.5–5.1)
Sodium: 139 mmol/L (ref 135–145)
Total Bilirubin: 0.5 mg/dL (ref 0.3–1.2)
Total Protein: 7.7 g/dL (ref 6.5–8.1)

## 2018-10-30 LAB — CBC WITH DIFFERENTIAL/PLATELET
ABS IMMATURE GRANULOCYTES: 0.03 10*3/uL (ref 0.00–0.07)
BASOS PCT: 1 %
Basophils Absolute: 0.1 10*3/uL (ref 0.0–0.1)
EOS ABS: 0.1 10*3/uL (ref 0.0–0.5)
Eosinophils Relative: 2 %
HEMATOCRIT: 40.1 % (ref 36.0–46.0)
Hemoglobin: 13.6 g/dL (ref 12.0–15.0)
Immature Granulocytes: 0 %
Lymphocytes Relative: 12 %
Lymphs Abs: 0.8 10*3/uL (ref 0.7–4.0)
MCH: 31.1 pg (ref 26.0–34.0)
MCHC: 33.9 g/dL (ref 30.0–36.0)
MCV: 91.8 fL (ref 80.0–100.0)
MONO ABS: 0.2 10*3/uL (ref 0.1–1.0)
MONOS PCT: 3 %
Neutro Abs: 6 10*3/uL (ref 1.7–7.7)
Neutrophils Relative %: 82 %
PLATELETS: 208 10*3/uL (ref 150–400)
RBC: 4.37 MIL/uL (ref 3.87–5.11)
RDW: 12.9 % (ref 11.5–15.5)
WBC: 7.3 10*3/uL (ref 4.0–10.5)
nRBC: 0 % (ref 0.0–0.2)

## 2018-10-30 LAB — LACTIC ACID, PLASMA: LACTIC ACID, VENOUS: 1.3 mmol/L (ref 0.5–1.9)

## 2018-10-30 LAB — TROPONIN I

## 2018-10-30 MED ORDER — METHYLPREDNISOLONE SODIUM SUCC 125 MG IJ SOLR: 60.0000 mg | Freq: Two times a day (BID) | INTRAMUSCULAR | Status: DC

## 2018-10-30 MED ORDER — IPRATROPIUM-ALBUTEROL 0.5-2.5 (3) MG/3ML IN SOLN
3.0000 mL | Freq: Once | RESPIRATORY_TRACT | Status: AC
  Administered 2018-10-30: 3 mL via RESPIRATORY_TRACT
  Filled 2018-10-30: qty 3

## 2018-10-30 MED ORDER — SODIUM CHLORIDE 0.9 % IV BOLUS
500.0000 mL | Freq: Once | INTRAVENOUS | Status: AC
  Administered 2018-10-30: 500 mL via INTRAVENOUS

## 2018-10-30 MED ORDER — PREDNISONE 20 MG PO TABS: 40.0000 mg | ORAL_TABLET | Freq: Every day | ORAL | Status: DC

## 2018-10-30 MED ORDER — METHYLPREDNISOLONE SODIUM SUCC 125 MG IJ SOLR
125.0000 mg | Freq: Once | INTRAMUSCULAR | Status: AC
  Administered 2018-10-30: 125 mg via INTRAVENOUS
  Filled 2018-10-30: qty 2

## 2018-10-30 MED ORDER — LACTATED RINGERS IV SOLN
INTRAVENOUS | Status: DC
  Administered 2018-10-31: 01:00:00 via INTRAVENOUS

## 2018-10-30 MED ORDER — IPRATROPIUM-ALBUTEROL 0.5-2.5 (3) MG/3ML IN SOLN
3.0000 mL | RESPIRATORY_TRACT | Status: DC
  Filled 2018-10-30 (×2): qty 3

## 2018-10-30 NOTE — ED Provider Notes (Signed)
[facility] Emergency Department Provider Note    First MD Initiated Contact with Patient 10/30/18 2051     (approximate)  I have reviewed the triage vital signs and the nursing notes.   HISTORY  Chief Complaint Emesis and flu like symptoms    HPI Jaelynne Layonna Dobie is a 53 y.o. female presents to the ER for evaluation of shortness of breath fevers and chills.  States she is also having backache and muscle aches.  States that she has been around several sick contacts recently diagnosed with the flu.  States that she has been feeling ill for 3 days.  Does have a home oxygen concentrator but has not been using it.  States she does have a history of severe emphysema feels like that is acting up.  States that she also feels like she is developed pneumonia.  Denies any abdominal pain.    Past Medical History:  Diagnosis Date   Blocked artery    Left leg   Cancer (HCC)    Cervical   COPD (chronic obstructive pulmonary disease) (HCC)    Emphysema lung (HCC)    Migraines    PTSD (post-traumatic stress disorder)    History reviewed. No pertinent family history. Past Surgical History:  Procedure Laterality Date   TUBAL LIGATION     There are no active problems to display for this patient.     Prior to Admission medications   Medication Sig Start Date End Date Taking? Authorizing Provider  albuterol (PROVENTIL HFA;VENTOLIN HFA) 108 (90 BASE) MCG/ACT inhaler Inhale 2 puffs into the lungs every 6 (six) hours as needed for wheezing or shortness of breath. 01/15/15   Joanne Gavel, MD  cyclobenzaprine (FLEXERIL) 10 MG tablet Take 1 tablet (10 mg total) by mouth 3 (three) times daily as needed. 04/28/16   Sable Feil, PA-C  predniSONE (DELTASONE) 20 MG tablet Take 3 tablets (60 mg total) by mouth daily. 01/15/15   Joanne Gavel, MD  traMADol (ULTRAM) 50 MG tablet Take 1 tablet (50 mg total) by mouth every 6 (six) hours as needed for moderate pain.  01/30/17   Sable Feil, PA-C    Allergies Patient has no known allergies.    Social History Social History   Tobacco Use   Smoking status: Current Every Day Smoker    Packs/day: 1.00    Years: 41.00    Pack years: 41.00    Types: Cigarettes   Smokeless tobacco: Never Used  Substance Use Topics   Alcohol use: No   Drug use: No    Review of Systems Patient denies headaches, rhinorrhea, blurry vision, numbness, shortness of breath, chest pain, edema, cough, abdominal pain, nausea, vomiting, diarrhea, dysuria, fevers, rashes or hallucinations unless otherwise stated above in HPI. ____________________________________________   PHYSICAL EXAM:  VITAL SIGNS: Vitals:   10/30/18 2215 10/30/18 2230  BP:  94/64  Pulse: 92 86  Resp: (!) 24 (!) 23  Temp:    SpO2: 97% 93%    Constitutional: Alert and oriented.  Eyes: Conjunctivae are normal.  Head: Atraumatic. Nose: No congestion/rhinnorhea. Mouth/Throat: Mucous membranes are moist.   Neck: No stridor. Painless ROM.  Cardiovascular: Normal rate, regular rhythm. Grossly normal heart sounds.  Good peripheral circulation. Respiratory: mild tacypnea with diminished breathsounds throughout Gastrointestinal: Soft and nontender. No distention. No abdominal bruits. No CVA tenderness. Genitourinary: deferred Musculoskeletal: No lower extremity tenderness nor edema.  No joint effusions. Neurologic:  Normal speech and language. No gross focal  neurologic deficits are appreciated. No facial droop Skin:  Skin is warm, dry and intact. No rash noted. Psychiatric: Mood and affect are normal. Speech and behavior are normal.  ____________________________________________   LABS (all labs ordered are listed, but only abnormal results are displayed)  Results for orders placed or performed during the hospital encounter of 10/30/18 (from the past 24 hour(s))  Lactic acid, plasma     Status: None   Collection Time: 10/30/18  8:43 PM    Result Value Ref Range   Lactic Acid, Venous 1.3 0.5 - 1.9 mmol/L  Comprehensive metabolic panel     Status: Abnormal   Collection Time: 10/30/18  8:43 PM  Result Value Ref Range   Sodium 139 135 - 145 mmol/L   Potassium 4.2 3.5 - 5.1 mmol/L   Chloride 106 98 - 111 mmol/L   CO2 23 22 - 32 mmol/L   Glucose, Bld 138 (H) 70 - 99 mg/dL   BUN 20 6 - 20 mg/dL   Creatinine, Ser 0.67 0.44 - 1.00 mg/dL   Calcium 9.3 8.9 - 10.3 mg/dL   Total Protein 7.7 6.5 - 8.1 g/dL   Albumin 4.3 3.5 - 5.0 g/dL   AST 24 15 - 41 U/L   ALT 21 0 - 44 U/L   Alkaline Phosphatase 67 38 - 126 U/L   Total Bilirubin 0.5 0.3 - 1.2 mg/dL   GFR calc non Af Amer >60 >60 mL/min   GFR calc Af Amer >60 >60 mL/min   Anion gap 10 5 - 15  Urinalysis, Complete w Microscopic     Status: Abnormal   Collection Time: 10/30/18  8:43 PM  Result Value Ref Range   Color, Urine YELLOW (A) YELLOW   APPearance CLOUDY (A) CLEAR   Specific Gravity, Urine 1.018 1.005 - 1.030   pH 8.0 5.0 - 8.0   Glucose, UA NEGATIVE NEGATIVE mg/dL   Hgb urine dipstick NEGATIVE NEGATIVE   Bilirubin Urine NEGATIVE NEGATIVE   Ketones, ur NEGATIVE NEGATIVE mg/dL   Protein, ur NEGATIVE NEGATIVE mg/dL   Nitrite NEGATIVE NEGATIVE   Leukocytes,Ua TRACE (A) NEGATIVE   RBC / HPF 11-20 0 - 5 RBC/hpf   WBC, UA 0-5 0 - 5 WBC/hpf   Bacteria, UA NONE SEEN NONE SEEN   Squamous Epithelial / LPF 0-5 0 - 5   Amorphous Crystal PRESENT   Troponin I - ONCE - STAT     Status: None   Collection Time: 10/30/18  8:43 PM  Result Value Ref Range   Troponin I <0.03 <0.03 ng/mL  CBC with Differential/Platelet     Status: None   Collection Time: 10/30/18  9:09 PM  Result Value Ref Range   WBC 7.3 4.0 - 10.5 K/uL   RBC 4.37 3.87 - 5.11 MIL/uL   Hemoglobin 13.6 12.0 - 15.0 g/dL   HCT 40.1 36.0 - 46.0 %   MCV 91.8 80.0 - 100.0 fL   MCH 31.1 26.0 - 34.0 pg   MCHC 33.9 30.0 - 36.0 g/dL   RDW 12.9 11.5 - 15.5 %   Platelets 208 150 - 400 K/uL   nRBC 0.0 0.0 - 0.2 %    Neutrophils Relative % 82 %   Neutro Abs 6.0 1.7 - 7.7 K/uL   Lymphocytes Relative 12 %   Lymphs Abs 0.8 0.7 - 4.0 K/uL   Monocytes Relative 3 %   Monocytes Absolute 0.2 0.1 - 1.0 K/uL   Eosinophils Relative 2 %   Eosinophils Absolute  0.1 0.0 - 0.5 K/uL   Basophils Relative 1 %   Basophils Absolute 0.1 0.0 - 0.1 K/uL   Immature Granulocytes 0 %   Abs Immature Granulocytes 0.03 0.00 - 0.07 K/uL   ____________________________________________  EKG My review and personal interpretation at Time: 20:49   Indication: sob  Rate: 90  Rhythm: sinus Axis: normal Other: normal intervals, no stemi ____________________________________________  RADIOLOGY  I personally reviewed all radiographic images ordered to evaluate for the above acute complaints and reviewed radiology reports and findings.  These findings were personally discussed with the patient.  Please see medical record for radiology report.  ____________________________________________   PROCEDURES  Procedure(s) performed:  Procedures    Critical Care performed: no ____________________________________________   INITIAL IMPRESSION / ASSESSMENT AND PLAN / ED COURSE  Pertinent labs & imaging results that were available during my care of the patient were reviewed by me and considered in my medical decision making (see chart for details).   DDX: Asthma, copd, CHF, pna, ptx, malignancy, Pe, anemia   Samai Emilly Lavey is a 53 y.o. who presents to the ED with history of emphysema presents with a flulike illness is well as shortness of breath.  States she ran out of her nebulizers.  Has been feeling more short of breath over the past several days.  No measured fevers.  No sick contacts.  The patient will be placed on continuous pulse oximetry and telemetry for monitoring.  Laboratory evaluation will be sent to evaluate for the above complaints.     Clinical Course as of Oct 29 2325  Thu Oct 30, 2018  2251 Reassessed with some  improvement after nebulizer treatments.  Will give steroid.   [PR]    Clinical Course User Index [PR] Merlyn Lot, MD    The patient was evaluated in Emergency Department today for the symptoms described in the history of present illness. He/she was evaluated in the context of the global COVID-19 pandemic, which necessitated consideration that the patient might be at risk for infection with the SARS-CoV-2 virus that causes COVID-19. Institutional protocols and algorithms that pertain to the evaluation of patients at risk for COVID-19 are in a state of rapid change based on information released by regulatory bodies including the CDC and federal and state organizations. These policies and algorithms were followed during the patient's care in the ED.  As part of my medical decision making, I reviewed the following data within the [facility] notes reviewed and incorporated, Labs reviewed, notes from prior ED visits and [place] Controlled Substance Database   ____________________________________________   FINAL CLINICAL IMPRESSION(S) / ED DIAGNOSES  Final diagnoses:  COPD exacerbation ([place])      NEW MEDICATIONS STARTED DURING THIS VISIT:  New Prescriptions   No medications on file     Note:  This document was prepared using Dragon voice recognition software and may include unintentional dictation errors.    Merlyn Lot, MD 10/30/18 [phone]

## 2018-10-30 NOTE — ED Notes (Signed)
ED TO INPATIENT HANDOFF REPORT  ED Nurse Name and Phone #: Joelene Millin Cintya Daughety, RN [phone]  S Name/Age/Gender Sandra West 53 y.o. female Room/Bed: ED06A/ED06A  Code Status   Code Status: Not on file  Home/SNF/Other Home Patient oriented to: self, place, time and situation Is this baseline? Yes   Triage Complete: Triage complete  Chief Complaint V/D/fever/cough/aches  Triage Note Had NVD for Saturday and Sunday but pt reports this resolved.  Here for congestion, cough, fever 101 at home for 3 days.  Hx emphysema.  Wheezy sound cough noted.  Right posterior lung pain.  Took tylenol earlier this evening.  Supposed to have nebs but out of.  + SHOB.  Back/lung hurst worse with inspiration. Kids are visiting from [place].     Allergies No Known Allergies  Level of Care/Admitting Diagnosis ED Disposition    ED Disposition Condition Comment   Admit  Hospital Area: [place] [[id number]]  Level of Care: Med-Surg [16]  Diagnosis: Acute exacerbation of chronic obstructive pulmonary disease (COPD) ([place]) [664403]  Admitting Physician: Arta Silence [[id number]]  Attending Physician: Arta Silence [[id number]]  PT Class (Do Not Modify): Observation [104]  PT Acc Code (Do Not Modify): Observation [10022]       B Medical/Surgery History Past Medical History:  Diagnosis Date  . Blocked artery    Left leg  . Cancer (HCC)    Cervical  . COPD (chronic obstructive pulmonary disease) ([place])   . Emphysema lung ([place])   . Migraines   . PTSD (post-traumatic stress disorder)    Past Surgical History:  Procedure Laterality Date  . TUBAL LIGATION       A IV Location/Drains/Wounds Patient Lines/Drains/Airways Status   Active Line/Drains/Airways    Name:   Placement date:   Placement time:   Site:   Days:   Peripheral IV 10/30/18 Right Forearm   10/30/18    2106    Forearm   less than 1          Intake/Output Last 24 hours No intake or output  data in the 24 hours ending 10/30/18 2349  Labs/Imaging Results for orders placed or performed during the hospital encounter of 10/30/18 (from the past 48 hour(s))  Lactic acid, plasma     Status: None   Collection Time: 10/30/18  8:43 PM  Result Value Ref Range   Lactic Acid, Venous 1.3 0.5 - 1.9 mmol/L    Comment: Performed at [facility], [address] [place], [place] [address]  Comprehensive metabolic panel     Status: Abnormal   Collection Time: 10/30/18  8:43 PM  Result Value Ref Range   Sodium 139 135 - 145 mmol/L   Potassium 4.2 3.5 - 5.1 mmol/L   Chloride 106 98 - 111 mmol/L   CO2 23 22 - 32 mmol/L   Glucose, Bld 138 (H) 70 - 99 mg/dL   BUN 20 6 - 20 mg/dL   Creatinine, Ser 0.67 0.44 - 1.00 mg/dL   Calcium 9.3 8.9 - 10.3 mg/dL   Total Protein 7.7 6.5 - 8.1 g/dL   Albumin 4.3 3.5 - 5.0 g/dL   AST 24 15 - 41 U/L   ALT 21 0 - 44 U/L   Alkaline Phosphatase 67 38 - 126 U/L   Total Bilirubin 0.5 0.3 - 1.2 mg/dL   GFR calc non Af Amer >60 >60 mL/min   GFR calc Af Amer >60 >60 mL/min   Anion gap 10 5 - 15  Comment: Performed at [facility], [address] [place], [place] [address]  Urinalysis, Complete w Microscopic     Status: Abnormal   Collection Time: 10/30/18  8:43 PM  Result Value Ref Range   Color, Urine YELLOW (A) YELLOW   APPearance CLOUDY (A) CLEAR   Specific Gravity, Urine 1.018 1.005 - 1.030   pH 8.0 5.0 - 8.0   Glucose, UA NEGATIVE NEGATIVE mg/dL   Hgb urine dipstick NEGATIVE NEGATIVE   Bilirubin Urine NEGATIVE NEGATIVE   Ketones, ur NEGATIVE NEGATIVE mg/dL   Protein, ur NEGATIVE NEGATIVE mg/dL   Nitrite NEGATIVE NEGATIVE   Leukocytes,Ua TRACE (A) NEGATIVE   RBC / HPF 11-20 0 - 5 RBC/hpf   WBC, UA 0-5 0 - 5 WBC/hpf   Bacteria, UA NONE SEEN NONE SEEN   Squamous Epithelial / LPF 0-5 0 - 5   Amorphous Crystal PRESENT     Comment: Performed at [facility], [address] [place], [place] [address]  Troponin I - ONCE  - STAT     Status: None   Collection Time: 10/30/18  8:43 PM  Result Value Ref Range   Troponin I <0.03 <0.03 ng/mL    Comment: Performed at [facility], [address] [place], [place] [address]  CBC with Differential/Platelet     Status: None   Collection Time: 10/30/18  9:09 PM  Result Value Ref Range   WBC 7.3 4.0 - 10.5 K/uL   RBC 4.37 3.87 - 5.11 MIL/uL   Hemoglobin 13.6 12.0 - 15.0 g/dL   HCT 40.1 36.0 - 46.0 %   MCV 91.8 80.0 - 100.0 fL   MCH 31.1 26.0 - 34.0 pg   MCHC 33.9 30.0 - 36.0 g/dL   RDW 12.9 11.5 - 15.5 %   Platelets 208 150 - 400 K/uL   nRBC 0.0 0.0 - 0.2 %   Neutrophils Relative % 82 %   Neutro Abs 6.0 1.7 - 7.7 K/uL   Lymphocytes Relative 12 %   Lymphs Abs 0.8 0.7 - 4.0 K/uL   Monocytes Relative 3 %   Monocytes Absolute 0.2 0.1 - 1.0 K/uL   Eosinophils Relative 2 %   Eosinophils Absolute 0.1 0.0 - 0.5 K/uL   Basophils Relative 1 %   Basophils Absolute 0.1 0.0 - 0.1 K/uL   Immature Granulocytes 0 %   Abs Immature Granulocytes 0.03 0.00 - 0.07 K/uL    Comment: Performed at [facility], [address] [place], [place] [address]   Dg Chest Portable 1 View  Result Date: 10/30/2018 CLINICAL DATA:  Initial evaluation for acute cough, shortness of breath. EXAM: PORTABLE CHEST 1 VIEW COMPARISON:  Prior radiograph from 01/15/2015. FINDINGS: Cardiac and mediastinal silhouettes are stable in size and contour, and remain within normal limits. Lungs mildly hypoinflated with secondary bibasilar bronchovascular crowding. Underlying changes related to COPD noted. No focal infiltrates. No pulmonary edema or pleural effusion. No pneumothorax. No acute osseous finding. IMPRESSION: 1. Low lung volumes with underlying COPD. 2. No other active cardiopulmonary disease. Electronically Signed   By: Jeannine Boga M.D.   On: 10/30/2018 21:39    Pending Labs Unresulted Labs (From admission, onward)    Start     Ordered   10/30/18 2324  Influenza panel by  PCR (type A & B)  (Influenza PCR Panel)  Once,   STAT     10/30/18 2324   10/30/18 2043  CBC with Differential  ONCE - STAT,   STAT  10/30/18 2043   Signed and Held  HIV antibody (Routine Testing)  Once,   R     Signed and Held          Vitals/Pain Today's Vitals   10/30/18 2145 10/30/18 2200 10/30/18 2215 10/30/18 2230  BP:  103/62  94/64  Pulse: 84 81 92 86  Resp: (!) 28 12 (!) 24 (!) 23  Temp:      TempSrc:      SpO2: 95% 99% 97% 93%  Weight:      Height:      PainSc:        Isolation Precautions No active isolations  Medications Medications  ipratropium-albuterol (DUONEB) 0.5-2.5 (3) MG/3ML nebulizer solution 3 mL (has no administration in time range)  methylPREDNISolone sodium succinate (SOLU-MEDROL) 125 mg/2 mL injection 60 mg (has no administration in time range)    Followed by  predniSONE (DELTASONE) tablet 40 mg (has no administration in time range)  lactated ringers infusion (has no administration in time range)  ipratropium-albuterol (DUONEB) 0.5-2.5 (3) MG/3ML nebulizer solution 3 mL (3 mLs Nebulization Given 10/30/18 2154)  ipratropium-albuterol (DUONEB) 0.5-2.5 (3) MG/3ML nebulizer solution 3 mL (3 mLs Nebulization Given 10/30/18 2154)  methylPREDNISolone sodium succinate (SOLU-MEDROL) 125 mg/2 mL injection 125 mg (125 mg Intravenous Given 10/30/18 2344)  sodium chloride 0.9 % bolus 500 mL (500 mLs Intravenous New Bag/Given 10/30/18 2347)    Mobility walks Low fall risk   Focused Assessments Pulmonary Assessment Handoff:  Lung sounds:   O2 Device: Room Air        R Recommendations: See Admitting Provider Note  Report given to:   Additional Notes: COPD

## 2018-10-30 NOTE — ED Triage Notes (Signed)
Had NVD for Saturday and Sunday but pt reports this resolved.  Here for congestion, cough, fever 101 at home for 3 days.  Hx emphysema.  Wheezy sound cough noted.  Right posterior lung pain.  Took tylenol earlier this evening.  Supposed to have nebs but out of.  + SHOB.  Back/lung hurst worse with inspiration. Kids are visiting from [place].

## 2018-10-31 LAB — RESPIRATORY PANEL BY PCR
Adenovirus: NOT DETECTED
Bordetella pertussis: NOT DETECTED
CORONAVIRUS HKU1-RVPPCR: NOT DETECTED
Chlamydophila pneumoniae: NOT DETECTED
Coronavirus 229E: NOT DETECTED
Coronavirus NL63: NOT DETECTED
Coronavirus OC43: NOT DETECTED
Influenza A: NOT DETECTED
Influenza B: NOT DETECTED
Metapneumovirus: NOT DETECTED
Mycoplasma pneumoniae: NOT DETECTED
PARAINFLUENZA VIRUS 1-RVPPCR: NOT DETECTED
Parainfluenza Virus 2: NOT DETECTED
Parainfluenza Virus 3: NOT DETECTED
Parainfluenza Virus 4: NOT DETECTED
Respiratory Syncytial Virus: NOT DETECTED
Rhinovirus / Enterovirus: DETECTED — AB

## 2018-10-31 LAB — INFLUENZA PANEL BY PCR (TYPE A & B)
Influenza A By PCR: NEGATIVE
Influenza B By PCR: NEGATIVE

## 2018-10-31 MED ORDER — PNEUMOCOCCAL VAC POLYVALENT 25 MCG/0.5ML IJ INJ
0.5000 mL | INJECTION | Freq: Once | INTRAMUSCULAR | Status: AC
  Administered 2018-10-31: 0.5 mL via INTRAMUSCULAR
  Filled 2018-10-31: qty 0.5

## 2018-10-31 MED ORDER — SENNOSIDES-DOCUSATE SODIUM 8.6-50 MG PO TABS: 1.0000 | ORAL_TABLET | Freq: Every evening | ORAL | Status: DC | PRN

## 2018-10-31 MED ORDER — GUAIFENESIN 100 MG/5ML PO SOLN
5.0000 mL | ORAL | Status: DC | PRN
  Filled 2018-10-31: qty 5

## 2018-10-31 MED ORDER — PNEUMOCOCCAL VAC POLYVALENT 25 MCG/0.5ML IJ INJ: 0.5000 mL | INJECTION | INTRAMUSCULAR | Status: DC

## 2018-10-31 MED ORDER — NICOTINE 21 MG/24HR TD PT24: 21.0000 mg | MEDICATED_PATCH | Freq: Every day | TRANSDERMAL | 0 refills | Status: DC

## 2018-10-31 MED ORDER — NICOTINE 21 MG/24HR TD PT24
21.0000 mg | MEDICATED_PATCH | Freq: Every day | TRANSDERMAL | Status: DC
  Administered 2018-10-31: 21 mg via TRANSDERMAL
  Filled 2018-10-31: qty 1

## 2018-10-31 MED ORDER — ONDANSETRON HCL 4 MG PO TABS: 4.0000 mg | ORAL_TABLET | Freq: Four times a day (QID) | ORAL | Status: DC | PRN

## 2018-10-31 MED ORDER — ONDANSETRON HCL 4 MG/2ML IJ SOLN: 4.0000 mg | Freq: Four times a day (QID) | INTRAMUSCULAR | Status: DC | PRN

## 2018-10-31 MED ORDER — BISACODYL 5 MG PO TBEC: 5.0000 mg | DELAYED_RELEASE_TABLET | Freq: Every day | ORAL | Status: DC | PRN

## 2018-10-31 MED ORDER — ENOXAPARIN SODIUM 40 MG/0.4ML ~~LOC~~ SOLN
40.0000 mg | SUBCUTANEOUS | Status: DC
  Administered 2018-10-31: 40 mg via SUBCUTANEOUS
  Filled 2018-10-31: qty 0.4

## 2018-10-31 MED ORDER — GUAIFENESIN 100 MG/5ML PO SOLN: 5.0000 mL | ORAL | 1 refills | Status: DC | PRN

## 2018-10-31 MED ORDER — IPRATROPIUM-ALBUTEROL 20-100 MCG/ACT IN AERS
1.0000 | INHALATION_SPRAY | Freq: Two times a day (BID) | RESPIRATORY_TRACT | Status: DC
  Filled 2018-10-31: qty 4

## 2018-10-31 MED ORDER — ACETAMINOPHEN 650 MG RE SUPP: 650.0000 mg | Freq: Four times a day (QID) | RECTAL | Status: DC | PRN

## 2018-10-31 MED ORDER — NICOTINE 21 MG/24HR TD PT24: 21.0000 mg | MEDICATED_PATCH | Freq: Every day | TRANSDERMAL | Status: DC

## 2018-10-31 MED ORDER — CYCLOBENZAPRINE HCL 10 MG PO TABS: 10.0000 mg | ORAL_TABLET | Freq: Three times a day (TID) | ORAL | Status: DC | PRN

## 2018-10-31 MED ORDER — IPRATROPIUM-ALBUTEROL 0.5-2.5 (3) MG/3ML IN SOLN
3.0000 mL | Freq: Two times a day (BID) | RESPIRATORY_TRACT | Status: DC
  Administered 2018-10-31: 3 mL via RESPIRATORY_TRACT
  Filled 2018-10-31: qty 3

## 2018-10-31 MED ORDER — ALBUTEROL SULFATE HFA 108 (90 BASE) MCG/ACT IN AERS
1.0000 | INHALATION_SPRAY | Freq: Four times a day (QID) | RESPIRATORY_TRACT | Status: DC | PRN
  Filled 2018-10-31: qty 6.7

## 2018-10-31 MED ORDER — ALBUTEROL SULFATE HFA 108 (90 BASE) MCG/ACT IN AERS: 2.0000 | INHALATION_SPRAY | Freq: Four times a day (QID) | RESPIRATORY_TRACT | 2 refills | Status: DC | PRN

## 2018-10-31 MED ORDER — ACETAMINOPHEN 325 MG PO TABS: 650.0000 mg | ORAL_TABLET | Freq: Four times a day (QID) | ORAL | Status: DC | PRN

## 2018-10-31 MED ORDER — TIOTROPIUM BROMIDE MONOHYDRATE 18 MCG IN CAPS: 18.0000 ug | ORAL_CAPSULE | Freq: Every day | RESPIRATORY_TRACT | 2 refills | Status: DC

## 2018-10-31 NOTE — Discharge Instructions (Signed)
Isolation. Follow-up COVID-19 test with PCP or CDC.

## 2018-10-31 NOTE — TOC Initial Note (Signed)
Transition of Care [facility]) - Initial/Assessment Note    Patient Details  Name: Sandra West MRN: [id number] Date of Birth: 05/14/1966  [facility] Care [facility]) CM/SW Contact:    Elza Rafter, RN Phone Number: 10/31/2018, 3:27 PM  Clinical Narrative:         Patient discharging today.  Rule out COVIS-19.  This RNCM tried several times to call into room and phone has been busy.  Patient does not have insurance or PCP.  Provided Bethel Born, RN with [facility] application and booklet of PCP clinics.            Expected Discharge Plan: Home/Self Care Barriers to Discharge: No Barriers Identified   Patient Goals and CMS Choice        Expected Discharge Plan and Services Expected Discharge Plan: Home/Self Care         Expected Discharge Date: 10/31/18                        Prior Living Arrangements/Services                       Activities of Daily Living Home Assistive Devices/Equipment: None ADL Screening (condition at time of admission) Patient's cognitive ability adequate to safely complete daily activities?: Yes Is the patient deaf or have difficulty hearing?: No Does the patient have difficulty seeing, even when wearing glasses/contacts?: No Does the patient have difficulty concentrating, remembering, or making decisions?: No Patient able to express need for assistance with ADLs?: Yes Does the patient have difficulty dressing or bathing?: No Independently performs ADLs?: Yes (appropriate for developmental age) Communication: Independent Dressing (OT): Independent Grooming: Independent Feeding: Independent Bathing: Independent Toileting: Independent In/Out Bed: Independent Walks in Home: Independent Does the patient have difficulty walking or climbing stairs?: No Weakness of Legs: None Weakness of Arms/Hands: None  Permission Sought/Granted                  Emotional Assessment              Admission diagnosis:  COPD exacerbation ([place])  [J44.1] Patient Active Problem List   Diagnosis Date Noted  . Acute exacerbation of chronic obstructive pulmonary disease (COPD) ([place]) 10/30/2018   PCP:  Patient, No Pcp Per Pharmacy:   [id number] [place], [address] [place] [address] Phone: [phone] Fax: [phone]     Social Determinants of Health (SDOH) Interventions    Readmission Risk Interventions No flowsheet data found.

## 2018-10-31 NOTE — TOC Transition Note (Signed)
Transition of Care [facility]) - CM/SW Discharge Note   Patient Details  Name: Sandra West MRN: [id number] Date of Birth: 05/06/1966  Transition [facility]) CM/SW Contact:  Elza Rafter, RN Phone Number: 10/31/2018, 3:29 PM   Clinical Narrative:   Discharging today.  [place] and PCP clinic information provided.     Final next level of care: Home/Self Care Barriers to Discharge: No Barriers Identified   Patient Goals and CMS Choice        Discharge Placement                       Discharge Plan and Services                          Social Determinants of Health (SDOH) Interventions     Readmission Risk Interventions No flowsheet data found.

## 2018-10-31 NOTE — H&P (Signed)
[place] at [place] NAME: Sandra West    MR#:  [id number]  DATE OF BIRTH:  [birth year]  DATE OF ADMISSION:  10/30/2018  PRIMARY CARE PHYSICIAN: Patient, No Pcp Per   REQUESTING/REFERRING PHYSICIAN: Merlyn Lot, MD  CHIEF COMPLAINT:   Chief Complaint  Patient presents with  . Emesis  . flu like symptoms    HISTORY OF PRESENT ILLNESS:  Sandra West  is a 53 y.o. female with a known history of COPD/emphysema p/w cough/SOB, acute COPD exacerbation (w/o hypoxemic respiratory failure). Pt reported to be wheezing on admission; lung sounds diminished w/ poor air mvmt (w/o active wheezing) at the time of my assessment. Endorses "flulike illness". Denies sick contacts. Developed N/V/D on Sun 03/22, presumed she had a viral GI illness per pt. Developed cough on Mon 03/23, dry/non-productive, as well as body aches/myalgias. Initially did not have SOB, and only became dyspneic w/ coughing fits/spells, though she states SOB has worsened since onset, and is now present at rest (though improving since receiving initial Tx in ED). Denies fever but endorses chills. Denies rigors, diaphoresis, night sweats. Endorses fatigue/malaise, generalized weakness, decreased exercise tolerance. Pt says she has been smoking 1ppd since age 10, supposedly. Flu (-). CXR (+) low lung volumes + COPD, (-) other active cardiopulmonary disease. States she is uninsured, and does not have a home pulse oximeter or nebulizer machine. She states she has an oxygen concentrator, but it is "not set up". (+) tachypnea, (-) tachycardia, (-) leukocytosis, (-) hypoxia, SIRS (-). Appears miserable but non-toxic; not in distress.  PAST MEDICAL HISTORY:   Past Medical History:  Diagnosis Date  . Blocked artery    Left leg  . Cancer (HCC)    Cervical  . COPD (chronic obstructive pulmonary disease) ([place])   . Emphysema lung ([place])   . Migraines   . PTSD (post-traumatic stress disorder)      PAST SURGICAL HISTORY:   Past Surgical History:  Procedure Laterality Date  . TUBAL LIGATION      SOCIAL HISTORY:   Social History   Tobacco Use  . Smoking status: Current Every Day Smoker    Packs/day: 1.00    Years: 41.00    Pack years: 41.00    Types: Cigarettes  . Smokeless tobacco: Never Used  Substance Use Topics  . Alcohol use: No    FAMILY HISTORY:  History reviewed. No pertinent family history.  DRUG ALLERGIES:  No Known Allergies  REVIEW OF SYSTEMS:   Review of Systems  Constitutional: Positive for chills and malaise/fatigue. Negative for diaphoresis, fever and weight loss.  HENT: Negative for congestion, ear pain, hearing loss, nosebleeds, sinus pain, sore throat and tinnitus.   Eyes: Negative for blurred vision, double vision and photophobia.  Respiratory: Positive for cough, shortness of breath and wheezing. Negative for hemoptysis and sputum production.   Cardiovascular: Negative for chest pain, palpitations, orthopnea, claudication, leg swelling and PND.  Gastrointestinal: Positive for diarrhea, nausea and vomiting. Negative for abdominal pain, blood in stool, constipation, heartburn and melena.  Genitourinary: Negative for dysuria, frequency, hematuria and urgency.  Musculoskeletal: Negative for back pain, falls, joint pain, myalgias and neck pain.  Skin: Negative for itching and rash.  Neurological: Positive for weakness. Negative for dizziness, tingling, tremors, sensory change, speech change, focal weakness, seizures, loss of consciousness and headaches.  Psychiatric/Behavioral: Negative for depression and memory loss. The patient is not nervous/anxious and does not have insomnia.    MEDICATIONS AT HOME:  Prior to Admission medications   Medication Sig Start Date End Date Taking? Authorizing Provider  albuterol (PROVENTIL HFA;VENTOLIN HFA) 108 (90 BASE) MCG/ACT inhaler Inhale 2 puffs into the lungs every 6 (six) hours as needed for wheezing or  shortness of breath. 01/15/15   Joanne Gavel, MD  cyclobenzaprine (FLEXERIL) 10 MG tablet Take 1 tablet (10 mg total) by mouth 3 (three) times daily as needed. 04/28/16   Sable Feil, PA-C  predniSONE (DELTASONE) 20 MG tablet Take 3 tablets (60 mg total) by mouth daily. 01/15/15   Joanne Gavel, MD  traMADol (ULTRAM) 50 MG tablet Take 1 tablet (50 mg total) by mouth every 6 (six) hours as needed for moderate pain. 01/30/17   Sable Feil, PA-C   VITAL SIGNS:  Blood pressure 110/64, pulse 86, temperature 98 F (36.7 C), temperature source Oral, resp. rate 17, height 5\' 6"  (1.676 m), weight 64.4 kg, SpO2 96 %.  PHYSICAL EXAMINATION:  Physical Exam Constitutional:      General: She is not in acute distress.    Appearance: She is ill-appearing. She is not toxic-appearing or diaphoretic.     Interventions: She is not intubated. HENT:     Head: Atraumatic.     Mouth/Throat:     Pharynx: Oropharynx is clear.  Eyes:     General: No scleral icterus.    Extraocular Movements: Extraocular movements intact.     Conjunctiva/sclera: Conjunctivae normal.  Neck:     Musculoskeletal: Neck supple.  Cardiovascular:     Rate and Rhythm: Normal rate and regular rhythm.  No extrasystoles are present.    Heart sounds: Normal heart sounds, S1 normal and S2 normal. No murmur. No friction rub. No gallop. No S3 or S4 sounds.   Pulmonary:     Effort: Tachypnea present. No bradypnea, accessory muscle usage, respiratory distress or retractions. She is not intubated.     Breath sounds: Decreased air movement present. No stridor or transmitted upper airway sounds. Examination of the right-upper field reveals decreased breath sounds. Examination of the left-upper field reveals decreased breath sounds. Examination of the right-middle field reveals decreased breath sounds. Examination of the left-middle field reveals decreased breath sounds. Examination of the right-lower field reveals decreased breath sounds.  Examination of the left-lower field reveals decreased breath sounds. Decreased breath sounds present. No wheezing, rhonchi or rales.  Abdominal:     General: There is no distension.     Palpations: Abdomen is soft.     Tenderness: There is no abdominal tenderness. There is no guarding or rebound.  Musculoskeletal: Normal range of motion.        General: No swelling or tenderness.     Right lower leg: No edema.     Left lower leg: No edema.  Lymphadenopathy:     Cervical: No cervical adenopathy.  Skin:    General: Skin is warm and dry.     Findings: No erythema or rash.  Neurological:     Mental Status: She is alert. Mental status is at baseline.  Psychiatric:        Mood and Affect: Mood normal.        Behavior: Behavior normal.        Thought Content: Thought content normal.        Judgment: Judgment normal.    LABORATORY PANEL:   CBC Recent Labs  Lab 10/30/18 2109  WBC 7.3  HGB 13.6  HCT 40.1  PLT 208   ------------------------------------------------------------------------------------------------------------------  Chemistries  Recent Labs  Lab 10/30/18 2043  NA 139  K 4.2  CL 106  CO2 23  GLUCOSE 138*  BUN 20  CREATININE 0.67  CALCIUM 9.3  AST 24  ALT 21  ALKPHOS 67  BILITOT 0.5   ------------------------------------------------------------------------------------------------------------------  Cardiac Enzymes Recent Labs  Lab 10/30/18 2043  TROPONINI <0.03   ------------------------------------------------------------------------------------------------------------------  RADIOLOGY:  Dg Chest Portable 1 View  Result Date: 10/30/2018 CLINICAL DATA:  Initial evaluation for acute cough, shortness of breath. EXAM: PORTABLE CHEST 1 VIEW COMPARISON:  Prior radiograph from 01/15/2015. FINDINGS: Cardiac and mediastinal silhouettes are stable in size and contour, and remain within normal limits. Lungs mildly hypoinflated with secondary bibasilar  bronchovascular crowding. Underlying changes related to COPD noted. No focal infiltrates. No pulmonary edema or pleural effusion. No pneumothorax. No acute osseous finding. IMPRESSION: 1. Low lung volumes with underlying COPD. 2. No other active cardiopulmonary disease. Electronically Signed   By: Jeannine Boga M.D.   On: 10/30/2018 21:39   IMPRESSION AND PLAN:   A/P: 68F w/ PMHx COPD/emphysema p/w cough/SOB, acute COPD exacerbation (w/o hypoxemic respiratory failure). Tobacco use D/O. Hyperglycemia. -Acute COPD exacerbation: Wheezing improved. Non-hypoxic on room air. (+) tachypnea, SIRS (-). Nebs, steroids (IV, transition to PO), O2, incentive spirometry, pulmonary toileting. Mild exacerbation, ABx not indicated. Ambulatory pulse ox. Possible viral URI. Flu (-). CXR (+) low lung volumes + COPD, (-) other active cardiopulmonary disease. States she is uninsured, and does not have a home pulse oximeter or nebulizer machine. She states she has an oxygen concentrator, but it is "not set up". -Tobacco use D/O: 1ppd smoker. Requests nicotine patch. Ordered. Smoking cessation advised. -Hyperglycemia: Likely 2/2 steroids. -Minimal home medication, held. -FEN/GI: Regular diet. -DVT PPx: Lovenox. -Code status: full code. -Disposition: Observation, < 2 midnights.   All the records are reviewed and case discussed with ED provider. Management plans discussed with the patient, family and they are in agreement.  CODE STATUS: Full code.  TOTAL TIME TAKING CARE OF THIS PATIENT: 75 minutes.    Arta Silence M.D on 10/31/2018 at 3:35 AM  Between 7am to 6pm - Pager - [phone]  After 6pm go to [email or web address] - Proofreader  [facility]  Office  [phone]  CC: Primary care physician; Patient, No Pcp Per   Note: This dictation was prepared with Dragon dictation along with smaller phrase technology. Any transcriptional errors that result from this process  are unintentional.

## 2018-10-31 NOTE — Discharge Summary (Addendum)
[facility] at [place] NAME: Sandra West    MR#:  [id number]  DATE OF BIRTH:  April 13, 1966  DATE OF ADMISSION:  10/30/2018   ADMITTING PHYSICIAN: Arta Silence, MD  DATE OF DISCHARGE: 10/31/2018  PRIMARY CARE PHYSICIAN: Patient, No Pcp Per   ADMISSION DIAGNOSIS:  COPD exacerbation ([place]) [J44.1] DISCHARGE DIAGNOSIS:  Active Problems:   Acute exacerbation of chronic obstructive pulmonary disease (COPD) ([place])  SECONDARY DIAGNOSIS:   Past Medical History:  Diagnosis Date  . Blocked artery    Left leg  . Cancer (HCC)    Cervical  . COPD (chronic obstructive pulmonary disease) ([place])   . Emphysema lung ([place])   . Migraines   . PTSD (post-traumatic stress disorder)    HOSPITAL COURSE:  A/P: 1F w/ PMHx COPD/emphysema p/w cough/SOB, acute COPD exacerbation (w/o hypoxemic respiratory failure). Tobacco use D/O. Hyperglycemia. -Acute COPD exacerbation and a history of emphysema: Wheezing improved. Non-hypoxic on room air. (+) tachypnea, SIRS (-). Nebs, steroids (IV, transition to PO), O2, incentive spirometry, pulmonary toileting. Mild exacerbation. She was given 1 dose IV steroid in ED.  Hold steroid due to suspected COVID-19. Albuterol inhaler as needed.   Possible viral URI. Flu (-). CXR (+) low lung volumes. Suspected COVID-19 due to fever, cough and shortness of breath, as well as recent ill contact with her kids from [place].  -Tobacco use D/O: Smoking cessation was counseled for 3 to 4 minutes, nicotine patch. DISCHARGE CONDITIONS:  Stable, discharged to home today. CONSULTS OBTAINED:  Treatment Team:  Arta Silence, MD DRUG ALLERGIES:  No Known Allergies DISCHARGE MEDICATIONS:   Allergies as of 10/31/2018   No Known Allergies     Medication List    STOP taking these medications   predniSONE 20 MG tablet Commonly known as:  DELTASONE   traMADol 50 MG tablet Commonly known as:  Ultram     TAKE these  medications   albuterol 108 (90 Base) MCG/ACT inhaler Commonly known as:  PROVENTIL HFA;VENTOLIN HFA Inhale 2 puffs into the lungs every 6 (six) hours as needed for wheezing or shortness of breath.   cyclobenzaprine 10 MG tablet Commonly known as:  FLEXERIL Take 1 tablet (10 mg total) by mouth 3 (three) times daily as needed.   guaiFENesin 100 MG/5ML Soln Commonly known as:  ROBITUSSIN Take 5 mLs (100 mg total) by mouth every 4 (four) hours as needed for cough or to loosen phlegm.   nicotine 21 mg/24hr patch Commonly known as:  NICODERM CQ - dosed in mg/24 hours Place 1 patch (21 mg total) onto the skin daily. Start taking on:  November 01, 2018   tiotropium 18 MCG inhalation capsule Commonly known as:  Spiriva HandiHaler Place 1 capsule (18 mcg total) into inhaler and inhale daily.        DISCHARGE INSTRUCTIONS:  See AVS.  If you experience worsening of your admission symptoms, develop shortness of breath, life threatening emergency, suicidal or homicidal thoughts you must seek medical attention immediately by calling 911 or calling your MD immediately  if symptoms less severe.  You Must read complete instructions/literature along with all the possible adverse reactions/side effects for all the Medicines you take and that have been prescribed to you. Take any new Medicines after you have completely understood and accpet all the possible adverse reactions/side effects.   Please note  You were cared for by a hospitalist during your hospital stay. If you have any questions about your discharge medications  or the care you received while you were in the hospital after you are discharged, you can call the unit and asked to speak with the hospitalist on call if the hospitalist that took care of you is not available. Once you are discharged, your primary care physician will handle any further medical issues. Please note that NO REFILLS for any discharge medications will be authorized once  you are discharged, as it is imperative that you return to your primary care physician (or establish a relationship with a primary care physician if you do not have one) for your aftercare needs so that they can reassess your need for medications and monitor your lab values.    On the day of Discharge:  VITAL SIGNS:  Blood pressure 110/64, pulse 86, temperature 98 F (36.7 C), temperature source Oral, resp. rate 17, height 5\' 6"  (1.676 m), weight 64.4 kg, SpO2 98 %. PHYSICAL EXAMINATION:  GENERAL:  53 y.o.-year-old patient lying in the bed with no acute distress.  EYES: Pupils equal, round, reactive to light and accommodation. No scleral icterus. Extraocular muscles intact.  HEENT: Head atraumatic, normocephalic. Oropharynx and nasopharynx clear.  NECK:  Supple, no jugular venous distention. No thyroid enlargement, no tenderness.  LUNGS: Normal breath sounds bilaterally, no wheezing, rales,rhonchi or crepitation. No use of accessory muscles of respiration.  CARDIOVASCULAR: S1, S2 normal. No murmurs, rubs, or gallops.  ABDOMEN: Soft, non-tender, non-distended. Bowel sounds present. No organomegaly or mass.  EXTREMITIES: No pedal edema, cyanosis, or clubbing.  NEUROLOGIC: Cranial nerves II through XII are intact. Muscle strength 5/5 in all extremities. Sensation intact. Gait not checked.  PSYCHIATRIC: The patient is alert and oriented x 3.  SKIN: No obvious rash, lesion, or ulcer.  DATA REVIEW:   CBC Recent Labs  Lab 10/30/18 2109  WBC 7.3  HGB 13.6  HCT 40.1  PLT 208    Chemistries  Recent Labs  Lab 10/30/18 2043  NA 139  K 4.2  CL 106  CO2 23  GLUCOSE 138*  BUN 20  CREATININE 0.67  CALCIUM 9.3  AST 24  ALT 21  ALKPHOS 32  BILITOT 0.5     Microbiology Results  Results for orders placed or performed during the hospital encounter of 01/15/15  Wet prep, genital     Status: Abnormal   Collection Time: 01/15/15  2:25 AM  Result Value Ref Range Status   Yeast Wet Prep  HPF POC NONE SEEN NONE SEEN Final   Trich, Wet Prep MODERATE (A) NONE SEEN Final   Clue Cells Wet Prep HPF POC NONE SEEN NONE SEEN Final   WBC, Wet Prep HPF POC MANY (A) NONE SEEN Final  Chlamydia/NGC rt PCR ([facility] only)     Status: None   Collection Time: 01/15/15  2:25 AM  Result Value Ref Range Status   Specimen source GC/Chlam ENDOCERVICAL  Final   Chlamydia Tr NOT DETECTED  Final   N gonorrhoeae NOT DETECTED  Final    Comment: (NOTE) 100  This methodology has not been evaluated in pregnant women or in 200  patients with a history of hysterectomy. 300 400  This methodology will not be performed on patients less than 19  years of age.     RADIOLOGY:  Dg Chest Portable 1 View  Result Date: 10/30/2018 CLINICAL DATA:  Initial evaluation for acute cough, shortness of breath. EXAM: PORTABLE CHEST 1 VIEW COMPARISON:  Prior radiograph from 01/15/2015. FINDINGS: Cardiac and mediastinal silhouettes are stable in size and contour, and  remain within normal limits. Lungs mildly hypoinflated with secondary bibasilar bronchovascular crowding. Underlying changes related to COPD noted. No focal infiltrates. No pulmonary edema or pleural effusion. No pneumothorax. No acute osseous finding. IMPRESSION: 1. Low lung volumes with underlying COPD. 2. No other active cardiopulmonary disease. Electronically Signed   By: Jeannine Boga M.D.   On: 10/30/2018 21:39     Management plans discussed with the patient, family and they are in agreement.  CODE STATUS: Full Code   TOTAL TIME TAKING CARE OF THIS PATIENT: 32 minutes.    Demetrios Loll M.D on 10/31/2018 at 1:49 PM  Between 7am to 6pm - Pager - [phone]  After 6pm go to [email or web address] - Proofreader  [facility]  Office  [phone]  CC: Primary care physician; Patient, No Pcp Per   Note: This dictation was prepared with Dragon dictation along with smaller phrase technology. Any transcriptional errors that  result from this process are unintentional.

## 2018-11-01 LAB — HIV ANTIBODY (ROUTINE TESTING W REFLEX): HIV Screen 4th Generation wRfx: NONREACTIVE

## 2018-11-03 LAB — NOVEL CORONAVIRUS, NAA (HOSP ORDER, SEND-OUT TO REF LAB; TAT 18-24 HRS): SARS-CoV-2, NAA: NOT DETECTED

## 2018-11-05 ENCOUNTER — Telehealth: Payer: Self-pay | Admitting: Emergency Medicine

## 2018-11-05 NOTE — Telephone Encounter (Signed)
Called patient to assure she saw novel corona result in her mychart.  There was no answer and just a busy signal.  Tried several times.

## 2020-04-15 ENCOUNTER — Other Ambulatory Visit: Payer: Self-pay

## 2020-04-15 DIAGNOSIS — Z20822 Contact with and (suspected) exposure to covid-19: Secondary | ICD-10-CM

## 2020-04-18 LAB — NOVEL CORONAVIRUS, NAA: SARS-CoV-2, NAA: NOT DETECTED

## 2020-08-01 ENCOUNTER — Other Ambulatory Visit: Payer: Self-pay

## 2020-08-01 DIAGNOSIS — Z20822 Contact with and (suspected) exposure to covid-19: Secondary | ICD-10-CM

## 2020-08-03 LAB — SARS-COV-2, NAA 2 DAY TAT

## 2020-08-03 LAB — NOVEL CORONAVIRUS, NAA: SARS-CoV-2, NAA: NOT DETECTED

## 2020-08-06 DIAGNOSIS — Z21 Asymptomatic human immunodeficiency virus [HIV] infection status: Secondary | ICD-10-CM

## 2020-08-06 DIAGNOSIS — B20 Human immunodeficiency virus [HIV] disease: Secondary | ICD-10-CM

## 2020-08-06 HISTORY — DX: Asymptomatic human immunodeficiency virus (hiv) infection status: Z21

## 2020-08-06 HISTORY — DX: Human immunodeficiency virus (HIV) disease: B20

## 2020-12-08 ENCOUNTER — Emergency Department
Admission: EM | Admit: 2020-12-08 | Discharge: 2020-12-08 | Disposition: A | Discharge status: Home / Self Care | Payer: Self-pay | Attending: Emergency Medicine | Admitting: Emergency Medicine

## 2020-12-08 ENCOUNTER — Emergency Department: Payer: Self-pay

## 2020-12-08 ENCOUNTER — Other Ambulatory Visit: Payer: Self-pay

## 2020-12-08 DIAGNOSIS — J449 Chronic obstructive pulmonary disease, unspecified: Secondary | ICD-10-CM | POA: Insufficient documentation

## 2020-12-08 DIAGNOSIS — R519 Headache, unspecified: Secondary | ICD-10-CM | POA: Insufficient documentation

## 2020-12-08 DIAGNOSIS — M791 Myalgia, unspecified site: Secondary | ICD-10-CM | POA: Insufficient documentation

## 2020-12-08 DIAGNOSIS — F17210 Nicotine dependence, cigarettes, uncomplicated: Secondary | ICD-10-CM | POA: Insufficient documentation

## 2020-12-08 DIAGNOSIS — R42 Dizziness and giddiness: Secondary | ICD-10-CM | POA: Insufficient documentation

## 2020-12-08 DIAGNOSIS — Z8541 Personal history of malignant neoplasm of cervix uteri: Secondary | ICD-10-CM | POA: Insufficient documentation

## 2020-12-08 DIAGNOSIS — Z7951 Long term (current) use of inhaled steroids: Secondary | ICD-10-CM | POA: Insufficient documentation

## 2020-12-08 DIAGNOSIS — Z720 Tobacco use: Secondary | ICD-10-CM

## 2020-12-08 LAB — CBC
HCT: 41 % (ref 36.0–46.0)
Hemoglobin: 13.8 g/dL (ref 12.0–15.0)
MCH: 29.6 pg (ref 26.0–34.0)
MCHC: 33.7 g/dL (ref 30.0–36.0)
MCV: 87.8 fL (ref 80.0–100.0)
Platelets: 216 10*3/uL (ref 150–400)
RBC: 4.67 MIL/uL (ref 3.87–5.11)
RDW: 13.2 % (ref 11.5–15.5)
WBC: 5.8 10*3/uL (ref 4.0–10.5)
nRBC: 0 % (ref 0.0–0.2)

## 2020-12-08 LAB — COMPREHENSIVE METABOLIC PANEL
ALT: 21 U/L (ref 0–44)
AST: 25 U/L (ref 15–41)
Albumin: 4.5 g/dL (ref 3.5–5.0)
Alkaline Phosphatase: 55 U/L (ref 38–126)
Anion gap: 8 (ref 5–15)
BUN: 15 mg/dL (ref 6–20)
CO2: 28 mmol/L (ref 22–32)
Calcium: 9.3 mg/dL (ref 8.9–10.3)
Chloride: 103 mmol/L (ref 98–111)
Creatinine, Ser: 0.75 mg/dL (ref 0.44–1.00)
GFR, Estimated: >60 mL/min (ref >60)
Glucose, Bld: 102 mg/dL — ABNORMAL HIGH (ref 70–99)
Potassium: 4.1 mmol/L (ref 3.5–5.1)
Sodium: 139 mmol/L (ref 135–145)
Total Bilirubin: 0.6 mg/dL (ref 0.3–1.2)
Total Protein: 7.8 g/dL (ref 6.5–8.1)

## 2020-12-08 LAB — DIFFERENTIAL
Abs Immature Granulocytes: 0.01 10*3/uL (ref 0.00–0.07)
Basophils Absolute: 0.1 10*3/uL (ref 0.0–0.1)
Basophils Relative: 1 %
Eosinophils Absolute: 0.2 10*3/uL (ref 0.0–0.5)
Eosinophils Relative: 3 %
Immature Granulocytes: 0 %
Lymphocytes Relative: 38 %
Lymphs Abs: 2.2 10*3/uL (ref 0.7–4.0)
Monocytes Absolute: 0.4 10*3/uL (ref 0.1–1.0)
Monocytes Relative: 8 %
Neutro Abs: 3 10*3/uL (ref 1.7–7.7)
Neutrophils Relative %: 50 %

## 2020-12-08 LAB — PROTIME-INR
INR: 1 (ref 0.8–1.2)
Prothrombin Time: 13.3 seconds (ref 11.4–15.2)

## 2020-12-08 LAB — APTT: aPTT: 28 seconds (ref 24–36)

## 2020-12-08 MED ORDER — DIPHENHYDRAMINE HCL 50 MG/ML IJ SOLN
25.0000 mg | Freq: Once | INTRAMUSCULAR | Status: AC
  Administered 2020-12-08: 25 mg via INTRAVENOUS
  Filled 2020-12-08: qty 1

## 2020-12-08 MED ORDER — SODIUM CHLORIDE 0.9% FLUSH: 3.0000 mL | Freq: Once | INTRAVENOUS | Status: DC

## 2020-12-08 MED ORDER — MECLIZINE HCL 25 MG PO CHEW: 1.0000 | CHEWABLE_TABLET | Freq: Every day | ORAL | 0 refills | Status: AC | PRN

## 2020-12-08 MED ORDER — LACTATED RINGERS IV BOLUS
1000.0000 mL | Freq: Once | INTRAVENOUS | Status: AC
  Administered 2020-12-08: 1000 mL via INTRAVENOUS

## 2020-12-08 MED ORDER — MAGNESIUM SULFATE 2 GM/50ML IV SOLN
2.0000 g | Freq: Once | INTRAVENOUS | Status: AC
  Administered 2020-12-08: 2 g via INTRAVENOUS
  Filled 2020-12-08: qty 50

## 2020-12-08 MED ORDER — PROCHLORPERAZINE EDISYLATE 10 MG/2ML IJ SOLN
10.0000 mg | Freq: Once | INTRAMUSCULAR | Status: AC
  Administered 2020-12-08: 10 mg via INTRAVENOUS
  Filled 2020-12-08: qty 2

## 2020-12-08 NOTE — ED Provider Notes (Signed)
[facility] Emergency Department Provider Note  ____________________________________________   Event Date/Time   First MD Initiated Contact with Patient 12/08/20 1718     (approximate)  I have reviewed the triage vital signs and the nursing notes.   HISTORY  Chief Complaint Dizziness   HPI Sandra West is a 55 y.o. female with a past medical history of COPD, tobacco abuse, migraine headaches, PTSD, and cervical cancer who presents for assessment approximately 3 weeks of intermittent episodes of vertigo associated with headache that feels similar to usual migraine headaches but a little worse some myalgias in both legs.  She denies any recent falls or injuries, vision changes, cough, shortness of breath, chest pain, Donnell pain, back pain, rash upper extremity pain, new numbness or weakness in her lower legs.  States she does have a soreness on and off over the last couple of weeks at least and that she will often take ibuprofen without new headache although does not seem be helping her headache much anymore.  States her headache often feel like it is radiating down into her back but this does not feel like she has a separate pain in her back.  No other acute concerns at this time.  She states he does not currently feel vertiginous although she will often feel like she cannot walk straight when she is acutely feeling dizzy.  Her vertigo is almost always precipitated by rapid movement of her head.         Past Medical History:  Diagnosis Date  . Blocked artery    Left leg  . Cancer (HCC)    Cervical  . COPD (chronic obstructive pulmonary disease) ([place])   . Emphysema lung ([place])   . Migraines   . PTSD (post-traumatic stress disorder)     Patient Active Problem List   Diagnosis Date Noted  . Acute exacerbation of chronic obstructive pulmonary disease (COPD) ([place]) 10/30/2018    Past Surgical History:  Procedure Laterality Date  . TUBAL LIGATION       Prior to Admission medications   Medication Sig Start Date End Date Taking? Authorizing Provider  Meclizine HCl (ANTIVERT) 25 MG CHEW Chew 1 tablet (25 mg total) by mouth daily as needed for up to 5 days. 12/08/20 12/13/20 Yes Lucrezia Starch, MD  albuterol (PROVENTIL HFA;VENTOLIN HFA) 108 (90 Base) MCG/ACT inhaler Inhale 2 puffs into the lungs every 6 (six) hours as needed for wheezing or shortness of breath. 10/31/18   Demetrios Loll, MD  cyclobenzaprine (FLEXERIL) 10 MG tablet Take 1 tablet (10 mg total) by mouth 3 (three) times daily as needed. 04/28/16   Sable Feil, PA-C  guaiFENesin (ROBITUSSIN) 100 MG/5ML SOLN Take 5 mLs (100 mg total) by mouth every 4 (four) hours as needed for cough or to loosen phlegm. 10/31/18   Demetrios Loll, MD  nicotine (NICODERM CQ - DOSED IN MG/24 HOURS) 21 mg/24hr patch Place 1 patch (21 mg total) onto the skin daily. 11/01/18   Demetrios Loll, MD  tiotropium (SPIRIVA HANDIHALER) 18 MCG inhalation capsule Place 1 capsule (18 mcg total) into inhaler and inhale daily. 10/31/18 10/31/19  Demetrios Loll, MD    Allergies Patient has no known allergies.  No family history on file.  Social History Social History   Tobacco Use  . Smoking status: Current Every Day Smoker    Packs/day: 1.00    Years: 41.00    Pack years: 41.00    Types: Cigarettes  . Smokeless tobacco: Never  Used  Vaping Use  . Vaping Use: Never used  Substance Use Topics  . Alcohol use: No  . Drug use: No    Review of Systems  Review of Systems  Constitutional: Negative for chills and fever.  HENT: Negative for sore throat.   Eyes: Positive for photophobia. Negative for pain.  Respiratory: Negative for cough and stridor.   Cardiovascular: Negative for chest pain.  Gastrointestinal: Negative for vomiting.  Genitourinary: Negative for dysuria.  Musculoskeletal: Positive for myalgias ( legs).  Skin: Negative for rash.  Neurological: Positive for dizziness and headaches. Negative for seizures and  loss of consciousness.  Psychiatric/Behavioral: Negative for suicidal ideas.  All other systems reviewed and are negative.     ____________________________________________   PHYSICAL EXAM:  VITAL SIGNS: ED Triage Vitals [12/08/20 1623]  Enc Vitals Group     BP 129/82     Pulse Rate 77     Resp 18     Temp 98.1 F (36.7 C)     Temp Source Oral     SpO2 99 %     Weight 145 lb (65.8 kg)     Height 5\' 6"  (1.676 m)     Head Circumference      Peak Flow      Pain Score 10     Pain Loc      Pain Edu?      Excl. in [place]?    Vitals:   12/08/20 1623  BP: 129/82  Pulse: 77  Resp: 18  Temp: 98.1 F (36.7 C)  SpO2: 99%   Physical Exam Vitals and nursing note reviewed.  Constitutional:      General: She is not in acute distress.    Appearance: She is well-developed.  HENT:     Head: Normocephalic and atraumatic.     Right Ear: External ear normal.     Left Ear: External ear normal.     Nose: Nose normal.  Eyes:     Conjunctiva/sclera: Conjunctivae normal.  Cardiovascular:     Rate and Rhythm: Normal rate and regular rhythm.     Heart sounds: No murmur heard.   Pulmonary:     Effort: Pulmonary effort is normal. No respiratory distress.     Breath sounds: Normal breath sounds.  Abdominal:     Palpations: Abdomen is soft.     Tenderness: There is no abdominal tenderness.  Musculoskeletal:     Cervical back: Neck supple.  Skin:    General: Skin is warm and dry.     Capillary Refill: Capillary refill takes less than 2 seconds.  Neurological:     Mental Status: She is alert and oriented to person, place, and time.  Psychiatric:        Mood and Affect: Mood normal.     Cranial nerves II through XII grossly intact.  No pronator drift.  No finger dysmetria.  Symmetric 5/5 strength of all extremities.  Sensation intact to light touch in all extremities.  Unremarkable unassisted gait.  2+ bilateral radial and DP pulses.  Patient has no overlying skin changes or areas  of focal tenderness over her bilateral lower to mid anterior tib-fib's where she states she has some discomfort.  She has forage motion at the bilateral knees and ankles without any effusion deformity or limitation range of motion. ____________________________________________   LABS (all labs ordered are listed, but only abnormal results are displayed)  Labs Reviewed  COMPREHENSIVE METABOLIC PANEL - Abnormal; Notable for the following components:  Result Value   Glucose, Bld 102 (*)    All other components within normal limits  PROTIME-INR  APTT  CBC  DIFFERENTIAL   ____________________________________________  EKG  Normal sinus rhythm with unremarkable intervals and normal axis and no evidence of acute ischemia or other significant underlying arrhythmia ____________________________________________  RADIOLOGY  ED MD interpretation: No acute intracranial abnormality.  Official radiology report(s): CT Head Wo Contrast  Result Date: 12/08/2020 CLINICAL DATA:  Headache and dizziness EXAM: CT HEAD WITHOUT CONTRAST TECHNIQUE: Contiguous axial images were obtained from the base of the skull through the vertex without intravenous contrast. COMPARISON:  None. FINDINGS: Brain: Ventricles and sulci are normal in size and configuration. There is no intracranial mass hemorrhage, extra-axial fluid collection, or midline shift. The brain parenchyma appears unremarkable. No evident acute infarct. Vascular: No hyperdense vessel.  No evident vascular calcification. Skull: The bony calvarium appears intact. Sinuses/Orbits: Visualized paranasal sinuses are clear. Visualized orbits appear symmetric bilaterally. Other: Mastoid air cells are clear. IMPRESSION: Study within normal limits. Electronically Signed   By: Lowella Grip III M.D.   On: 12/08/2020 16:58    ____________________________________________   PROCEDURES  Procedure(s) performed (including Critical Care):  .1-3 Lead EKG  Interpretation Performed by: Lucrezia Starch, MD Authorized by: Lucrezia Starch, MD     Interpretation: normal     ECG rate assessment: normal     Rhythm: sinus rhythm     Ectopy: none     Conduction: normal       ____________________________________________   INITIAL IMPRESSION / ASSESSMENT AND PLAN / ED COURSE        Patient presents with [place] to history exam for assessment of headache associate with some intermittent episodes of vertigo over the last couple weeks and some myalgias in her legs "on and off for the last couple weeks.  She is afebrile hemodynamically stable.  States she is not currently feeling any vertigo but does have a headache that has been ongoing for last couple weeks.  With regard to patient's vertiginous symptoms and headache overall very low suspicion for CVA.  She has no focal deficits including evidence of nystagmus and was observed by myself to ambulate with steady gait unassisted.  In addition she states she is not currently feeling vertiginous suspect likely intermittent peripheral vertigo.  With regard to her headache CT obtained shows no evidence of intracranial hemorrhage and there are no findings on history exam to suggest deep space infection of the neck.  History exam is not consistent with temporal arteritis and no significant metabolic or electrolyte derangements noted on CMP.  CBC shows no leukocytosis or acute anemia.  Patient treated with migraine cocktail and on reassessment states she feels much better.  Given stable vitals with otherwise reassuring head CT work-up patient able to ambulate with steady gait unassisted she is safe for continued outpatient evaluation.  For vertigo.  Will write for course of meclizine.  With regard to her myalgias in her extremities unclear etiology at this time.  No history exam findings suggest acute traumatic injury, acute infectious process, DVT or compartment syndrome.  She is neurovascularly intact and there is  no evidence of weakness.  Given this ongoing over several week suspect she is stable for continued outpatient evaluation with PCP.  Will refer to [facility] for patient to establish PCP care.       ____________________________________________   FINAL CLINICAL IMPRESSION(S) / ED DIAGNOSES  Final diagnoses:  Vertigo  Nonintractable headache, unspecified chronicity pattern,  unspecified headache type  Myalgia  Tobacco abuse    Medications  sodium chloride flush (NS) 0.9 % injection 3 mL (3 mLs Intravenous Not Given 12/08/20 1722)  magnesium sulfate IVPB 2 g 50 mL (2 g Intravenous New Bag/Given 12/08/20 1823)  lactated ringers bolus 1,000 mL (1,000 mLs Intravenous New Bag/Given 12/08/20 1808)  prochlorperazine (COMPAZINE) injection 10 mg (10 mg Intravenous Given 12/08/20 1808)  diphenhydrAMINE (BENADRYL) injection 25 mg (25 mg Intravenous Given 12/08/20 1809)     ED Discharge Orders         Ordered    Meclizine HCl (ANTIVERT) 25 MG CHEW  Daily PRN        12/08/20 1853           Note:  This document was prepared using Dragon voice recognition software and may include unintentional dictation errors.   Lucrezia Starch, MD 12/08/20 [phone]

## 2020-12-08 NOTE — ED Triage Notes (Signed)
Pt to ER with complaints of a migraine and dizziness x3 weeks. States when she walks that she feels like she is leaning to the right. Pt having difficulty ambulating back to triage room. States she feels like her left leg os intermittently numb. Reports she had a "clot starting in her left leg" when she went to [facility] ER x1 year ago. Denies blood thinner usage.   States she has been using ibuprofen for her migraine with little relief.

## 2022-03-08 ENCOUNTER — Encounter: Payer: Self-pay | Admitting: Emergency Medicine

## 2022-03-08 ENCOUNTER — Emergency Department
Admission: EM | Admit: 2022-03-08 | Discharge: 2022-03-08 | Disposition: A | Discharge status: Home / Self Care | Payer: Self-pay | Attending: Emergency Medicine | Admitting: Emergency Medicine

## 2022-03-08 ENCOUNTER — Other Ambulatory Visit: Payer: Self-pay

## 2022-03-08 ENCOUNTER — Emergency Department: Payer: Self-pay

## 2022-03-08 DIAGNOSIS — J441 Chronic obstructive pulmonary disease with (acute) exacerbation: Secondary | ICD-10-CM | POA: Insufficient documentation

## 2022-03-08 DIAGNOSIS — Z20822 Contact with and (suspected) exposure to covid-19: Secondary | ICD-10-CM | POA: Insufficient documentation

## 2022-03-08 LAB — COMPREHENSIVE METABOLIC PANEL
ALT: 16 U/L (ref 0–44)
AST: 20 U/L (ref 15–41)
Albumin: 3.2 g/dL — ABNORMAL LOW (ref 3.5–5.0)
Alkaline Phosphatase: 55 U/L (ref 38–126)
Anion gap: 7 (ref 5–15)
BUN: 15 mg/dL (ref 6–20)
CO2: 25 mmol/L (ref 22–32)
Calcium: 8.7 mg/dL — ABNORMAL LOW (ref 8.9–10.3)
Chloride: 107 mmol/L (ref 98–111)
Creatinine, Ser: 0.7 mg/dL (ref 0.44–1.00)
GFR, Estimated: >60 mL/min (ref >60)
Glucose, Bld: 109 mg/dL — ABNORMAL HIGH (ref 70–99)
Potassium: 4.3 mmol/L (ref 3.5–5.1)
Sodium: 139 mmol/L (ref 135–145)
Total Bilirubin: 0.5 mg/dL (ref 0.3–1.2)
Total Protein: 6.8 g/dL (ref 6.5–8.1)

## 2022-03-08 LAB — CBC
HCT: 35.2 % — ABNORMAL LOW (ref 36.0–46.0)
Hemoglobin: 11.7 g/dL — ABNORMAL LOW (ref 12.0–15.0)
MCH: 29.5 pg (ref 26.0–34.0)
MCHC: 33.2 g/dL (ref 30.0–36.0)
MCV: 88.9 fL (ref 80.0–100.0)
Platelets: 141 10*3/uL — ABNORMAL LOW (ref 150–400)
RBC: 3.96 MIL/uL (ref 3.87–5.11)
RDW: 13.8 % (ref 11.5–15.5)
WBC: 4 10*3/uL (ref 4.0–10.5)
nRBC: 0 % (ref 0.0–0.2)

## 2022-03-08 LAB — SARS CORONAVIRUS 2 BY RT PCR: SARS Coronavirus 2 by RT PCR: NEGATIVE

## 2022-03-08 LAB — TROPONIN I (HIGH SENSITIVITY): Troponin I (High Sensitivity): 3 ng/L (ref <18)

## 2022-03-08 LAB — D-DIMER, QUANTITATIVE: D-Dimer, Quant: 0.92 ug/mL-FEU — ABNORMAL HIGH (ref 0.00–0.50)

## 2022-03-08 MED ORDER — SPIRIVA HANDIHALER 18 MCG IN CAPS: 18.0000 ug | ORAL_CAPSULE | Freq: Every day | RESPIRATORY_TRACT | 2 refills | Status: DC

## 2022-03-08 MED ORDER — SODIUM CHLORIDE 0.9 % IV BOLUS
1000.0000 mL | Freq: Once | INTRAVENOUS | Status: AC
  Administered 2022-03-08: 1000 mL via INTRAVENOUS

## 2022-03-08 MED ORDER — PREDNISONE 20 MG PO TABS: 40.0000 mg | ORAL_TABLET | Freq: Every day | ORAL | 0 refills | Status: AC

## 2022-03-08 MED ORDER — METOCLOPRAMIDE HCL 5 MG/ML IJ SOLN
10.0000 mg | INTRAMUSCULAR | Status: AC
  Administered 2022-03-08: 10 mg via INTRAVENOUS
  Filled 2022-03-08: qty 2

## 2022-03-08 MED ORDER — METOCLOPRAMIDE HCL 10 MG PO TABS: 10.0000 mg | ORAL_TABLET | Freq: Four times a day (QID) | ORAL | 0 refills | Status: DC | PRN

## 2022-03-08 MED ORDER — IPRATROPIUM-ALBUTEROL 0.5-2.5 (3) MG/3ML IN SOLN
9.0000 mL | Freq: Once | RESPIRATORY_TRACT | Status: AC
  Administered 2022-03-08: 9 mL via RESPIRATORY_TRACT
  Filled 2022-03-08: qty 9

## 2022-03-08 MED ORDER — IOHEXOL 350 MG/ML SOLN
75.0000 mL | Freq: Once | INTRAVENOUS | Status: AC | PRN
  Administered 2022-03-08: 75 mL via INTRAVENOUS

## 2022-03-08 MED ORDER — AZITHROMYCIN 250 MG PO TABS: ORAL_TABLET | ORAL | 0 refills | Status: DC

## 2022-03-08 MED ORDER — METHYLPREDNISOLONE SODIUM SUCC 125 MG IJ SOLR
125.0000 mg | INTRAMUSCULAR | Status: AC
  Administered 2022-03-08: 125 mg via INTRAVENOUS
  Filled 2022-03-08: qty 2

## 2022-03-08 MED ORDER — ALBUTEROL SULFATE HFA 108 (90 BASE) MCG/ACT IN AERS: 2.0000 | INHALATION_SPRAY | Freq: Four times a day (QID) | RESPIRATORY_TRACT | 2 refills | Status: AC | PRN

## 2022-03-08 MED ORDER — KETOROLAC TROMETHAMINE 15 MG/ML IJ SOLN
15.0000 mg | Freq: Once | INTRAMUSCULAR | Status: AC
  Administered 2022-03-08: 15 mg via INTRAVENOUS
  Filled 2022-03-08: qty 1

## 2022-03-08 MED ORDER — KETOROLAC TROMETHAMINE 10 MG PO TABS: 10.0000 mg | ORAL_TABLET | Freq: Four times a day (QID) | ORAL | 0 refills | Status: DC | PRN

## 2022-03-08 NOTE — ED Notes (Signed)
See triage note  Presents with h/a for about 1-2 mons Pain starts in back of neck and moves into right side of head  Denies any n/v or photosensitivity     No fever   States she has a hx of COPD and currently does not have a PCP

## 2022-03-08 NOTE — ED Provider Notes (Signed)
[facility] Provider Note    Event Date/Time   First MD Initiated Contact with Patient 03/08/22 1121     (approximate)   History   Chief Complaint: Cough   HPI  Sandra West is a 56 y.o. female with a history of emphysema/COPD, migraines who comes the ED complaining of cough productive of clear sputum for the past several days.  Also having a gradual onset worsening generalized headache for the past month.  No fever or body aches.  She does feel somewhat short of breath, and has pleuritic discomfort with deep breathing and coughing.  She relates all the symptoms to living in a trailer home with a leaky roof and black mold exposure.     Physical Exam   Triage Vital Signs: ED Triage Vitals  Enc Vitals Group     BP 03/08/22 1054 129/88     Pulse Rate 03/08/22 1054 80     Resp 03/08/22 1054 20     Temp 03/08/22 1054 97.7 F (36.5 C)     Temp Source 03/08/22 1054 Oral     SpO2 03/08/22 1054 100 %     Weight 03/08/22 1054 145 lb (65.8 kg)     Height 03/08/22 1054 '5\' 6"'$  (1.676 m)     Head Circumference --      Peak Flow --      Pain Score 03/08/22 1106 10     Pain Loc --      Pain Edu? --      Excl. in [place]? --     Most recent vital signs: Vitals:   03/08/22 1054  BP: 129/88  Pulse: 80  Resp: 20  Temp: 97.7 F (36.5 C)  SpO2: 100%    General: Awake, no distress.  CV:  Good peripheral perfusion.  Normal distal pulses.  Regular rate and rhythm. Resp:  Normal effort.  Symmetric air movement bilaterally, no consolidation or crackles.  There is expiratory wheezing and prolonged expiratory phase which is accentuated with FEV1 maneuver. Abd:  No distention.  Other:  No lower extremity edema or calf tenderness.  Negative Homans' sign.   ED Results / Procedures / Treatments   Labs (all labs ordered are listed, but only abnormal results are displayed) Labs Reviewed  CBC - Abnormal; Notable for the following components:      Result Value    Hemoglobin 11.7 (*)    HCT 35.2 (*)    Platelets 141 (*)    All other components within normal limits  COMPREHENSIVE METABOLIC PANEL - Abnormal; Notable for the following components:   Glucose, Bld 109 (*)    Calcium 8.7 (*)    Albumin 3.2 (*)    All other components within normal limits  D-DIMER, QUANTITATIVE - Abnormal; Notable for the following components:   D-Dimer, Quant 0.92 (*)    All other components within normal limits  SARS CORONAVIRUS 2 BY RT PCR  TROPONIN I (HIGH SENSITIVITY)  TROPONIN I (HIGH SENSITIVITY)     EKG Interpreted by me Normal sinus rhythm rate of 67.  Normal axis and intervals.  Poor R wave progression.  Normal ST segments and T waves.   RADIOLOGY Chest x-ray interpreted by me, no consolidation or pneumothorax or effusion.  Radiology report reviewed noting peribronchial thickening.   PROCEDURES:  Procedures   MEDICATIONS ORDERED IN ED: Medications  sodium chloride 0.9 % bolus 1,000 mL (1,000 mLs Intravenous New Bag/Given 03/08/22 1208)  methylPREDNISolone sodium succinate (SOLU-MEDROL) 125 mg/2  mL injection 125 mg (125 mg Intravenous Given 03/08/22 1209)  ipratropium-albuterol (DUONEB) 0.5-2.5 (3) MG/3ML nebulizer solution 9 mL (9 mLs Nebulization Given 03/08/22 1213)  ketorolac (TORADOL) 15 MG/ML injection 15 mg (15 mg Intravenous Given 03/08/22 1209)  metoCLOPramide (REGLAN) injection 10 mg (10 mg Intravenous Given 03/08/22 1209)  iohexol (OMNIPAQUE) 350 MG/ML injection 75 mL (75 mLs Intravenous Contrast Given 03/08/22 1340)     IMPRESSION / MDM / ASSESSMENT AND PLAN / ED COURSE  I reviewed the triage vital signs and the nursing notes.                              Differential diagnosis includes, but is not limited to, COPD exacerbation, dehydration, AKI, electrolyte abnormality, pulmonary embolism, viral illness  Patient's presentation is most consistent with acute presentation with potential threat to life or bodily function.  Patient presents  with shortness of breath, productive cough, wheezing.  EKG chest x-ray are unremarkable, exam suggestive of COPD exacerbation.  Patient reports poor oral intake during this time of symptoms.  Will check labs including D-dimer and COVID test.  Symptoms are noncardiac, not exertional.  Troponins or other cardiac work-up not indicated.   Clinical Course as of 03/08/22 1428  Thu Mar 08, 2022  1358 Patient feels much better.  Headache resolved.  Breathing feels back to normal, and on repeat auscultation there is no expiratory wheezing and expiratory phase is normalized.  D-dimer is elevated, will obtain CTA. [PS]    Clinical Course User Index [PS] Carrie Mew, MD    ----------------------------------------- 2:28 PM on 03/08/2022 ----------------------------------------- CT negative.  Will discharge with prednisone azithromycin and inhaler refills, along with Toradol and Reglan to take as needed for recurrent headache syndrome.   FINAL CLINICAL IMPRESSION(S) / ED DIAGNOSES   Final diagnoses:  COPD exacerbation ([place])     Rx / DC Orders   ED Discharge Orders          Ordered    ketorolac (TORADOL) 10 MG tablet  Every 6 hours PRN        03/08/22 1424    metoCLOPramide (REGLAN) 10 MG tablet  Every 6 hours PRN        03/08/22 1424    azithromycin (ZITHROMAX Z-PAK) 250 MG tablet        03/08/22 1425    predniSONE (DELTASONE) 20 MG tablet  Daily with breakfast        03/08/22 1425    albuterol (VENTOLIN HFA) 108 (90 Base) MCG/ACT inhaler  Every 6 hours PRN        03/08/22 1426    tiotropium (SPIRIVA HANDIHALER) 18 MCG inhalation capsule  Daily        03/08/22 1426             Note:  This document was prepared using Dragon voice recognition software and may include unintentional dictation errors.   Carrie Mew, MD 03/08/22 [phone]

## 2022-03-08 NOTE — ED Triage Notes (Signed)
Pt here with a cough. Pt was in a place for a month with black mold and states her breathing has been getting much worse. Pt also has rashes all over her. Pt also c/o a headache, hx of emphysema.

## 2022-03-08 NOTE — Discharge Instructions (Addendum)
Your CT scan of the chest was okay today.  No sign of any blood clots or large pneumonia.  Take prednisone and inhalers to control your breathing symptoms.  Take azithromycin to protect your lungs from developing pneumonia during this exacerbation.  In the future if you have a recurrent migraine headache, you can take Toradol and metoclopramide together to help resolve the headache.  It can also help to take 25 mg of Benadryl with these medicines.

## 2022-04-04 ENCOUNTER — Telehealth: Payer: Self-pay | Admitting: Family Medicine

## 2022-04-04 NOTE — Telephone Encounter (Signed)
Pt has questions about whether or not she qualifies for Hep C testing.

## 2022-04-05 NOTE — Telephone Encounter (Signed)
Pt states she has a rash on her hands, ankles, behind her knee and on her back.  Spoke with patient about the STD testing that we offer.  Transferred her to the front desk to make an appointment.  Windle Guard, RN

## 2022-04-12 ENCOUNTER — Ambulatory Visit: Payer: Self-pay | Admitting: Advanced Practice Midwife

## 2022-04-12 ENCOUNTER — Encounter: Payer: Self-pay | Admitting: Advanced Practice Midwife

## 2022-04-12 DIAGNOSIS — F1410 Cocaine abuse, uncomplicated: Secondary | ICD-10-CM

## 2022-04-12 DIAGNOSIS — Z789 Other specified health status: Secondary | ICD-10-CM | POA: Insufficient documentation

## 2022-04-12 DIAGNOSIS — J439 Emphysema, unspecified: Secondary | ICD-10-CM

## 2022-04-12 DIAGNOSIS — Z62810 Personal history of physical and sexual abuse in childhood: Secondary | ICD-10-CM | POA: Insufficient documentation

## 2022-04-12 DIAGNOSIS — K089 Disorder of teeth and supporting structures, unspecified: Secondary | ICD-10-CM

## 2022-04-12 DIAGNOSIS — T7412XS Child physical abuse, confirmed, sequela: Secondary | ICD-10-CM

## 2022-04-12 DIAGNOSIS — Z113 Encounter for screening for infections with a predominantly sexual mode of transmission: Secondary | ICD-10-CM

## 2022-04-12 DIAGNOSIS — F4310 Post-traumatic stress disorder, unspecified: Secondary | ICD-10-CM | POA: Insufficient documentation

## 2022-04-12 DIAGNOSIS — Z7251 High risk heterosexual behavior: Secondary | ICD-10-CM

## 2022-04-12 DIAGNOSIS — R32 Unspecified urinary incontinence: Secondary | ICD-10-CM

## 2022-04-12 DIAGNOSIS — F419 Anxiety disorder, unspecified: Secondary | ICD-10-CM

## 2022-04-12 DIAGNOSIS — T7412XA Child physical abuse, confirmed, initial encounter: Secondary | ICD-10-CM | POA: Insufficient documentation

## 2022-04-12 DIAGNOSIS — A539 Syphilis, unspecified: Secondary | ICD-10-CM

## 2022-04-12 DIAGNOSIS — F17200 Nicotine dependence, unspecified, uncomplicated: Secondary | ICD-10-CM

## 2022-04-12 LAB — HM HIV SCREENING LAB: HM HIV Screening: POSITIVE

## 2022-04-12 LAB — WET PREP FOR TRICH, YEAST, CLUE
Trichomonas Exam: NEGATIVE
Yeast Exam: NEGATIVE

## 2022-04-12 LAB — HEPATITIS B SURFACE ANTIGEN

## 2022-04-12 LAB — HM HEPATITIS C SCREENING LAB: HM Hepatitis Screen: POSITIVE

## 2022-04-12 MED ORDER — PENICILLIN G BENZATHINE 1200000 UNIT/2ML IM SUSY
2.4000 10*6.[IU] | PREFILLED_SYRINGE | Freq: Once | INTRAMUSCULAR | Status: AC
  Administered 2022-04-12: 2.4 10*6.[IU] via INTRAMUSCULAR

## 2022-04-12 NOTE — Progress Notes (Signed)
WET PREP negative. Pt verbalized understanding of additional labwork pending. Pt received Bicillin 2.4 million units IM today and walked in hall 15 min post injection with no issues.  Keitha Butte RN

## 2022-04-12 NOTE — Progress Notes (Signed)
[facility]  STI clinic/screening visit [place] [address] [phone]  Subjective:  Sandra West is a 56 y.o. SWF smoker G2P2 female being seen today for an STI screening visit. The patient reports they do have symptoms.  Patient reports that they do not desire a pregnancy in the next year.   They reported they are not interested in discussing contraception today.    No LMP recorded (lmp unknown). Patient is postmenopausal.   Patient has the following medical conditions:   Patient Active Problem List   Diagnosis Date Noted   Smoker 1 ppd 04/12/2022   Acute exacerbation of chronic obstructive pulmonary disease (COPD) ([place]) 10/30/2018    Chief Complaint  Patient presents with   SEXUALLY TRANSMITTED DISEASE    Screening    HPI  Patient reports c/o rash onset 3 mo ago that began on her back then hands, wrists, ankles, back of knees which is resolving now. Rash never itched. Went to [facility] ER 2-3 wks ago and was not tested for STD's per pt but given an antibiotic for her lungs due to living in camper with mold and rats and no running water since 05/2021. Pt states she has no toilet because no running water so relieves herself "outside" and takes a shower at her mom's trailer several times a week. Last MJ yesterday. Last crack 6 years ago. Last IV cocaine 15 years ago. Smoking 1 ppd. Last ETOH 5 years ago. 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 LMP age 72.   Last HIV test per patient/review of record was 10/31/2018 Patient reports last pap was 2006 neg per pt   Screening for MPX risk: Does the patient have an unexplained rash? Yes Is the patient MSM? No Does the patient endorse multiple sex partners or anonymous sex partners? No Did the patient have close or sexual contact with a person diagnosed with MPX? No Has the patient traveled outside the [place] where MPX is endemic? No Is there a high clinical suspicion for  MPX-- evidenced by one of the following No  -Unlikely to be chickenpox  -Lymphadenopathy  -Rash that present in same phase of evolution on any given body part See flowsheet for further details and programmatic requirements.   Immunization history:  Immunization History  Administered Date(s) Administered   Pneumococcal Polysaccharide-23 10/31/2018   Tdap 01/30/2017     The following portions of the patient's history were reviewed and updated as appropriate: allergies, current medications, past medical history, past social history, past surgical history and problem list.  Objective:  There were no vitals filed for this visit.  Physical Exam Vitals and nursing note reviewed.  Constitutional:      Appearance: Normal appearance. She is normal weight.  HENT:     Head: Normocephalic and atraumatic.     Mouth/Throat:     Mouth: Mucous membranes are moist.     Pharynx: Oropharynx is clear. No oropharyngeal exudate or posterior oropharyngeal erythema.  Eyes:     Conjunctiva/sclera: Conjunctivae normal.  Pulmonary:     Effort: Pulmonary effort is normal.  Abdominal:     General: Abdomen is flat.     Palpations: Abdomen is soft. There is no mass.     Tenderness: There is no abdominal tenderness. There is no rebound.     Comments: Soft without masses or tenderness  Genitourinary:    General: Normal vulva.     Exam position: Lithotomy position.     Pubic Area: No  rash or pubic lice.      Labia:        Right: No rash or lesion.        Left: No rash or lesion.      Vagina: Vaginal discharge (white creamy leukorrhea, ph>4.5) present. No erythema, bleeding or lesions.     Cervix: Normal.     Uterus: Normal.      Adnexa: Right adnexa normal and left adnexa normal.     Rectum: Normal.     Comments: pH = >4.5 C/o urinary incontinence Lymphadenopathy:     Head:     Right side of head: No preauricular or posterior auricular adenopathy.     Left side of head: No preauricular or posterior  auricular adenopathy.     Cervical: No cervical adenopathy.     Right cervical: No superficial, deep or posterior cervical adenopathy.    Left cervical: No superficial, deep or posterior cervical adenopathy.     Upper Body:     Right upper body: No supraclavicular, axillary or epitrochlear adenopathy.     Left upper body: No supraclavicular, axillary or epitrochlear adenopathy.     Lower Body: No right inguinal adenopathy. No left inguinal adenopathy.  Skin:    General: Skin is warm and dry.     Findings: No rash (flat hyperpigmented rash on back, hands, wrists, ankles, back of knees resolving but onset 3 mo ago).  Neurological:     Mental Status: She is alert and oriented to person, place, and time.     Assessment and Plan:  Sandra West is a 56 y.o. female presenting to the [facility] Department for STI screening  1. 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 White, FNP--needs Bicillin IM x1 today Treat wet mount per standing orders Immunization nurse consult Gave pt R. Karrie Doffing, LCSW # to call for housing assistance Gave pt contact # for Milton Ferguson, LCSW  - Chlamydia/Gonorrhea [facility] - Syphilis Serology, [place] [facility] - WET PREP FOR [place], YEAST, CLUE - HIV/HCV [place] [facility] - HBV Antigen/Antibody [facility]  3. Smoker Counseled via 5 A's to stop smoking     No follow-ups on file.  No future appointments.  Herbie Saxon, CNM

## 2022-04-17 LAB — GONOCOCCUS CULTURE

## 2022-04-18 ENCOUNTER — Telehealth: Payer: Self-pay

## 2022-04-23 ENCOUNTER — Ambulatory Visit: Payer: Self-pay | Admitting: Nurse Practitioner

## 2022-04-23 DIAGNOSIS — B20 Human immunodeficiency virus [HIV] disease: Secondary | ICD-10-CM

## 2022-04-23 DIAGNOSIS — Z21 Asymptomatic human immunodeficiency virus [HIV] infection status: Secondary | ICD-10-CM

## 2022-04-23 DIAGNOSIS — Z1159 Encounter for screening for other viral diseases: Secondary | ICD-10-CM

## 2022-04-23 DIAGNOSIS — Z8619 Personal history of other infectious and parasitic diseases: Secondary | ICD-10-CM

## 2022-04-23 NOTE — Progress Notes (Signed)
56 year old female in clinic today for a repeat HCV  testing.  Patient had testing done on 04/12/22 for all STDs.  Not enough blood was collected for HCV.  Patient reports a history of HCV and recently found out she was HIV positive.  Counseling and resources provided to patient through [facility].  No other questions at this time. Will await test results. Gregary Cromer, FNP    Total time spent: 20 minutes

## 2022-04-25 ENCOUNTER — Ambulatory Visit: Payer: Medicaid Other

## 2022-04-25 NOTE — Progress Notes (Signed)
[facility] DIS Staff, Lewie Loron, informed patient of test results 04/23/2022. Repeat HCV testing done 04/23/2022.

## 2022-04-26 DIAGNOSIS — J4489 Other specified chronic obstructive pulmonary disease: Secondary | ICD-10-CM

## 2022-04-26 DIAGNOSIS — R21 Rash and other nonspecific skin eruption: Secondary | ICD-10-CM | POA: Insufficient documentation

## 2022-04-26 DIAGNOSIS — Z8541 Personal history of malignant neoplasm of cervix uteri: Secondary | ICD-10-CM | POA: Insufficient documentation

## 2022-05-02 ENCOUNTER — Telehealth: Payer: Self-pay

## 2022-05-02 DIAGNOSIS — Z205 Contact with and (suspected) exposure to viral hepatitis: Secondary | ICD-10-CM | POA: Insufficient documentation

## 2022-05-02 NOTE — Telephone Encounter (Signed)
PC to pt to discuss Hep C testing results. Pt answered and stated she was at work . She would have to call back when she had a break.

## 2022-05-02 NOTE — Telephone Encounter (Signed)
PC received from pt. Advised pt of Hep C test results done on 04/23/22. Results indicate prior exposure to Hep C with clearance of infection. Advised not protected from repeat exposure. Pt verbalizes understanding and appreciative for call.

## 2022-05-10 ENCOUNTER — Ambulatory Visit: Payer: Medicaid Other | Admitting: Licensed Clinical Social Worker

## 2022-05-23 ENCOUNTER — Ambulatory Visit: Payer: Medicaid Other | Admitting: Licensed Clinical Social Worker
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
## 2022-07-09 ENCOUNTER — Emergency Department
Admission: EM | Admit: 2022-07-09 | Discharge: 2022-07-10 | Disposition: A | Discharge status: Home / Self Care | Payer: Medicaid Other | Attending: Emergency Medicine | Admitting: Emergency Medicine

## 2022-07-09 ENCOUNTER — Other Ambulatory Visit: Payer: Self-pay

## 2022-07-09 ENCOUNTER — Emergency Department: Payer: Medicaid Other

## 2022-07-09 DIAGNOSIS — Z8541 Personal history of malignant neoplasm of cervix uteri: Secondary | ICD-10-CM | POA: Insufficient documentation

## 2022-07-09 DIAGNOSIS — J441 Chronic obstructive pulmonary disease with (acute) exacerbation: Secondary | ICD-10-CM | POA: Insufficient documentation

## 2022-07-09 DIAGNOSIS — Z7951 Long term (current) use of inhaled steroids: Secondary | ICD-10-CM | POA: Insufficient documentation

## 2022-07-09 DIAGNOSIS — F17210 Nicotine dependence, cigarettes, uncomplicated: Secondary | ICD-10-CM | POA: Diagnosis not present

## 2022-07-09 DIAGNOSIS — Z5900 Homelessness unspecified: Secondary | ICD-10-CM | POA: Insufficient documentation

## 2022-07-09 DIAGNOSIS — Z1152 Encounter for screening for COVID-19: Secondary | ICD-10-CM | POA: Diagnosis not present

## 2022-07-09 DIAGNOSIS — R0602 Shortness of breath: Secondary | ICD-10-CM | POA: Diagnosis present

## 2022-07-09 DIAGNOSIS — Z21 Asymptomatic human immunodeficiency virus [HIV] infection status: Secondary | ICD-10-CM | POA: Diagnosis not present

## 2022-07-09 LAB — CBC WITH DIFFERENTIAL/PLATELET
Abs Immature Granulocytes: 0.02 10*3/uL (ref 0.00–0.07)
Basophils Absolute: 0 10*3/uL (ref 0.0–0.1)
Basophils Relative: 1 %
Eosinophils Absolute: 0.1 10*3/uL (ref 0.0–0.5)
Eosinophils Relative: 1 %
HCT: 33.9 % — ABNORMAL LOW (ref 36.0–46.0)
Hemoglobin: 11 g/dL — ABNORMAL LOW (ref 12.0–15.0)
Immature Granulocytes: 0 %
Lymphocytes Relative: 23 %
Lymphs Abs: 1.3 10*3/uL (ref 0.7–4.0)
MCH: 29.3 pg (ref 26.0–34.0)
MCHC: 32.4 g/dL (ref 30.0–36.0)
MCV: 90.2 fL (ref 80.0–100.0)
Monocytes Absolute: 0.4 10*3/uL (ref 0.1–1.0)
Monocytes Relative: 6 %
Neutro Abs: 4.1 10*3/uL (ref 1.7–7.7)
Neutrophils Relative %: 69 %
Platelets: 200 10*3/uL (ref 150–400)
RBC: 3.76 MIL/uL — ABNORMAL LOW (ref 3.87–5.11)
RDW: 14 % (ref 11.5–15.5)
WBC: 5.9 10*3/uL (ref 4.0–10.5)
nRBC: 0 % (ref 0.0–0.2)

## 2022-07-09 LAB — BASIC METABOLIC PANEL
Anion gap: 9 (ref 5–15)
BUN: 13 mg/dL (ref 6–20)
CO2: 25 mmol/L (ref 22–32)
Calcium: 8.2 mg/dL — ABNORMAL LOW (ref 8.9–10.3)
Chloride: 102 mmol/L (ref 98–111)
Creatinine, Ser: 0.61 mg/dL (ref 0.44–1.00)
GFR, Estimated: >60 mL/min (ref >60)
Glucose, Bld: 115 mg/dL — ABNORMAL HIGH (ref 70–99)
Potassium: 3.6 mmol/L (ref 3.5–5.1)
Sodium: 136 mmol/L (ref 135–145)

## 2022-07-09 LAB — RESP PANEL BY RT-PCR (FLU A&B, COVID) ARPGX2
Influenza A by PCR: NEGATIVE
Influenza B by PCR: NEGATIVE
SARS Coronavirus 2 by RT PCR: NEGATIVE
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
## 2022-07-09 NOTE — ED Triage Notes (Signed)
Patient reports shortness of breath, nasal congestion, headaches and intermittent fever for the past two weeks. Reports history of emphysema and HIV. Denies chest pain. AOX4. Resp even, regular on RA.

## 2022-07-09 NOTE — ED Notes (Signed)
Multiple attempts made by staff to collect repeat labs without success. Lab contacted for assistance by Jenetta Downer, EDT at this time

## 2022-07-09 NOTE — ED Notes (Signed)
Lab contacted to add on troponin at this time

## 2022-07-10 LAB — TROPONIN I (HIGH SENSITIVITY): Troponin I (High Sensitivity): 3 ng/L (ref <18)

## 2022-07-10 MED ORDER — HYDROCOD POLI-CHLORPHE POLI ER 10-8 MG/5ML PO SUER
5.0000 mL | Freq: Once | ORAL | Status: AC
  Administered 2022-07-10: 5 mL via ORAL
  Filled 2022-07-10: qty 5

## 2022-07-10 MED ORDER — IPRATROPIUM-ALBUTEROL 0.5-2.5 (3) MG/3ML IN SOLN
3.0000 mL | Freq: Once | RESPIRATORY_TRACT | Status: AC
Start: 2022-07-10 — End: 2022-07-10
  Administered 2022-07-10: 3 mL via RESPIRATORY_TRACT
  Filled 2022-07-10: qty 3

## 2022-07-10 MED ORDER — METHYLPREDNISOLONE SODIUM SUCC 125 MG IJ SOLR
125.0000 mg | Freq: Once | INTRAMUSCULAR | Status: AC
  Administered 2022-07-10: 125 mg via INTRAVENOUS
  Filled 2022-07-10: qty 2

## 2022-07-10 NOTE — ED Notes (Signed)
Lamar Blinks is coming to pick patient up.

## 2022-07-10 NOTE — ED Notes (Signed)
Charging patients phone so she can contact someone to come get here
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
## 2022-07-10 NOTE — ED Notes (Signed)
Meal tray ordered 

## 2022-07-10 NOTE — ED Notes (Signed)
Meal tray provided.
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
## 2022-07-10 NOTE — Discharge Instructions (Signed)
Rent/Utility/Housing  Agency Name: [facility] Agency Address: [address] [place], [place] [address] Phone: [phone] Email: [email or web address] Website: [email or web address] Service(s) Offered: Housing services, self-sufficiency, congregate meal  program, weatherization program, Administrator, sports program, emergency food assistance,  housing counseling, home ownership program, wheels -towork program.  Agency Name: [facility] Address: [id number], [place], [place] [address] Phone: [phone] (8a-[phone] (8p- 10p) Email: [email or web address] Website: [email or web address] Service(s) Offered: A program for homeless and/or needy men that includes one-on-one counseling, life skills training and job rehabilitation.  Agency Name: [employer] [facility] [place] Address: [address] [place], [place] [address] Phone: [phone] Website: www.[email or web address].org Service(s) Offered: Assistance to needy in emergency with utility bills, heating  fuel, and prescriptions. Shelter for homeless 7pm-7am. November 29, 2016 15  Agency Name: [facility] [place] [facility]ally Disabled) Address: [address], [place], [place] [address] Phone: [phone] Contact Person: Susanne Greenhouse Email: [email or web address] Website: [email or web address] Service(s) Offered: Helps individuals with developmental disabilities move  from housing that is more restrictive to homes where they  can achieve greater independence and have more  opportunities.  Agency Name: [employer] Address: [address], [place], [place] [address] Phone: [phone] Email: [email or web address] Website: www.[email or web address].org Service(s) Offered: Provides affordable housing for low-income families,  elderly, and disabled individuals. Offer a wide range of  programs and services, from financial planning  to afterschool and summer programs.  Agency Name: [place] Address: [address] [place], [place] [address] Phone: [phone] Service(s) Offered: Child support services; child welfare services; food stamps;  Medicaid; work first family assistance; and aid with fuel,  rent, food and medicine.  Agency Name: [facility] [place]. Address: [facility] [address], [place], [place]  [address] Phone: [phone] Website: www.familyabuseservices.org Service(s) Offered: 24 hour Crisis Line: [phone]; 24 hour Emergency Shelter;  Transitional Housing; Support Groups; Lexicographer;  [employer]; Hispanic Outreach: 6788701182;  [place]: [phone]. November 29, 2016 16  Agency Name: [facility] Address: [address] [place], [place] [address] Phone: [phone] Service(s) Offered: CAP Services; Home and [employer]; Individual  or Group Supports; Respite Care Non-Institutional Nursing;  Residential Supports; Respite Care and [place]; Transportation; Family and Friends Night;  Recreational Activities; Three Nutritious Meals/Snacks;  Consultation with Registered Dietician; Twenty-four hour  Registered Nurse Access; Daily and [employer]; [place]; [place] for the [employer] (During Summer Months) Bingo Night (Every  Wednesday Night); Special Populations Dance Night  (Every Tuesday Night); Professional Hair Care Services.  Agency Name: [facility] Address: [address], [place], [place] [address] Phone: [phone] Contact Person: Flonnie Hailstone Website: [email or web address].Pharmacist, hospital) Offered: Residential treatment facility for women; food and  clothing, educational & employment development and  transportation to work; Psychologist, counselling of financial skills;  parenting and family  reunification; emotional and spiritual  support; transitional housing for program graduates.  Agency Name: [employer] Address: [address], [place], [place] [address] Phone: [phone] Email: [email or web address] Website: [email or web address]://www.[email or web address]/ Service(s) Offered: Public housing units for elderly, disabled, and low income  people; housing choice vouchers for income eligible  applicants; shelter plus care vouchers; and [employer] program. November 29, 2016 17  Agency Name: [employer] [employer] [facility] Address: [place] [address] [place], [place] [address] Phone: [phone] Email: [email or web address] Website: www.habitatalamance.org Service(s) Offered: Build houses for families in need of decent housing. Each  adult in the family must invest 200 hours of labor on  someone else's house, work with volunteers to build their  own house, attend classes on budgeting, home maintenance, yard care, and attend homeowner association  meetings.  Agency Name: [facility] Address: [place] [address], [place], [place] [address] Phone: [phone] Website: www.rsli.org Service(s) Offered: Intermediate care facilities for mentally retarded,  Supervised Living in group homes for adults with  developmental disabilities, Supervised Living for people  who have dual diagnoses (MRMI), Independent Living,  Supported Living, respite and a variety of CAP services,  pre-vocational services, day supports, and [employer].  Agency Name: [place] Phone: [phone] Website: www.NCForeclosurePrevention.gov Service(s) Offered: Zero-interest, deferred loans to homeowners struggling to  pay their mortgage. Call for more information

## 2022-07-10 NOTE — ED Provider Notes (Signed)
Patient was seen by social work and cleared for discharge home.  She has a fill of her HIV medicine about a month ago and does report having them.  Offered her new prescriptions but she declined.  Asked patient if she needed any prescriptions for her shortness of breath and she declined.  She reports that her breathing is what it normally is.  She reports already having inhalers.    Vanessa [place], MD 07/10/22 [phone]

## 2022-09-14 DIAGNOSIS — F1410 Cocaine abuse, uncomplicated: Secondary | ICD-10-CM | POA: Insufficient documentation

## 2022-09-14 DIAGNOSIS — A549 Gonococcal infection, unspecified: Secondary | ICD-10-CM | POA: Insufficient documentation

## 2023-03-03 ENCOUNTER — Other Ambulatory Visit: Payer: Self-pay

## 2023-03-03 ENCOUNTER — Inpatient Hospital Stay
Admission: EM | Admit: 2023-03-03 | Discharge: 2023-03-08 | DRG: 975 | Disposition: A | Discharge status: Home / Self Care | Payer: Medicaid Other | Attending: Internal Medicine | Admitting: Internal Medicine

## 2023-03-03 ENCOUNTER — Emergency Department: Payer: Medicaid Other

## 2023-03-03 DIAGNOSIS — Z720 Tobacco use: Secondary | ICD-10-CM | POA: Insufficient documentation

## 2023-03-03 DIAGNOSIS — E872 Acidosis, unspecified: Secondary | ICD-10-CM | POA: Diagnosis present

## 2023-03-03 DIAGNOSIS — Z8249 Family history of ischemic heart disease and other diseases of the circulatory system: Secondary | ICD-10-CM

## 2023-03-03 DIAGNOSIS — Z1152 Encounter for screening for COVID-19: Secondary | ICD-10-CM | POA: Diagnosis not present

## 2023-03-03 DIAGNOSIS — F17210 Nicotine dependence, cigarettes, uncomplicated: Secondary | ICD-10-CM | POA: Diagnosis present

## 2023-03-03 DIAGNOSIS — D649 Anemia, unspecified: Secondary | ICD-10-CM | POA: Diagnosis present

## 2023-03-03 DIAGNOSIS — Z681 Body mass index (BMI) 19 or less, adult: Secondary | ICD-10-CM | POA: Diagnosis not present

## 2023-03-03 DIAGNOSIS — Y92009 Unspecified place in unspecified non-institutional (private) residence as the place of occurrence of the external cause: Secondary | ICD-10-CM | POA: Diagnosis not present

## 2023-03-03 DIAGNOSIS — R7989 Other specified abnormal findings of blood chemistry: Secondary | ICD-10-CM | POA: Insufficient documentation

## 2023-03-03 DIAGNOSIS — Z841 Family history of disorders of kidney and ureter: Secondary | ICD-10-CM | POA: Diagnosis not present

## 2023-03-03 DIAGNOSIS — R634 Abnormal weight loss: Secondary | ICD-10-CM | POA: Insufficient documentation

## 2023-03-03 DIAGNOSIS — I700 Atherosclerosis of aorta: Secondary | ICD-10-CM | POA: Diagnosis present

## 2023-03-03 DIAGNOSIS — J440 Chronic obstructive pulmonary disease with acute lower respiratory infection: Secondary | ICD-10-CM | POA: Diagnosis present

## 2023-03-03 DIAGNOSIS — Z599 Problem related to housing and economic circumstances, unspecified: Secondary | ICD-10-CM

## 2023-03-03 DIAGNOSIS — J4489 Other specified chronic obstructive pulmonary disease: Secondary | ICD-10-CM

## 2023-03-03 DIAGNOSIS — J159 Unspecified bacterial pneumonia: Secondary | ICD-10-CM | POA: Diagnosis present

## 2023-03-03 DIAGNOSIS — E8809 Other disorders of plasma-protein metabolism, not elsewhere classified: Secondary | ICD-10-CM | POA: Diagnosis present

## 2023-03-03 DIAGNOSIS — J181 Lobar pneumonia, unspecified organism: Secondary | ICD-10-CM | POA: Diagnosis not present

## 2023-03-03 DIAGNOSIS — T50996A Underdosing of other drugs, medicaments and biological substances, initial encounter: Secondary | ICD-10-CM | POA: Diagnosis present

## 2023-03-03 DIAGNOSIS — Z91128 Patient's intentional underdosing of medication regimen for other reason: Secondary | ICD-10-CM

## 2023-03-03 DIAGNOSIS — D72819 Decreased white blood cell count, unspecified: Secondary | ICD-10-CM | POA: Diagnosis present

## 2023-03-03 DIAGNOSIS — B20 Human immunodeficiency virus [HIV] disease: Secondary | ICD-10-CM | POA: Diagnosis present

## 2023-03-03 DIAGNOSIS — J432 Centrilobular emphysema: Secondary | ICD-10-CM | POA: Diagnosis not present

## 2023-03-03 DIAGNOSIS — J439 Emphysema, unspecified: Secondary | ICD-10-CM | POA: Insufficient documentation

## 2023-03-03 DIAGNOSIS — Z21 Asymptomatic human immunodeficiency virus [HIV] infection status: Secondary | ICD-10-CM | POA: Insufficient documentation

## 2023-03-03 DIAGNOSIS — R0603 Acute respiratory distress: Secondary | ICD-10-CM | POA: Diagnosis present

## 2023-03-03 DIAGNOSIS — Z716 Tobacco abuse counseling: Secondary | ICD-10-CM | POA: Diagnosis not present

## 2023-03-03 DIAGNOSIS — J189 Pneumonia, unspecified organism: Secondary | ICD-10-CM | POA: Diagnosis present

## 2023-03-03 DIAGNOSIS — J441 Chronic obstructive pulmonary disease with (acute) exacerbation: Secondary | ICD-10-CM | POA: Diagnosis present

## 2023-03-03 DIAGNOSIS — R000 Tachycardia, unspecified: Secondary | ICD-10-CM | POA: Diagnosis present

## 2023-03-03 HISTORY — DX: Chronic obstructive pulmonary disease, unspecified: J44.9

## 2023-03-03 LAB — LACTIC ACID, PLASMA
Lactic Acid, Venous: 1.3 mmol/L (ref 0.5–1.9)
Lactic Acid, Venous: 2.5 mmol/L (ref 0.5–1.9)
Lactic Acid, Venous: 1.6 mmol/L (ref 0.5–1.9)

## 2023-03-03 LAB — CBC
HCT: 31.2 % — ABNORMAL LOW (ref 36.0–46.0)
Hemoglobin: 10.4 g/dL — ABNORMAL LOW (ref 12.0–15.0)
MCH: 30.1 pg (ref 26.0–34.0)
MCHC: 33.3 g/dL (ref 30.0–36.0)
MCV: 90.4 fL (ref 80.0–100.0)
Platelets: 175 10*3/uL (ref 150–400)
RBC: 3.45 MIL/uL — ABNORMAL LOW (ref 3.87–5.11)
RDW: 14.5 % (ref 11.5–15.5)
WBC: 5.7 10*3/uL (ref 4.0–10.5)
nRBC: 0 % (ref 0.0–0.2)

## 2023-03-03 LAB — BASIC METABOLIC PANEL
Anion gap: 7 (ref 5–15)
BUN: 11 mg/dL (ref 6–20)
CO2: 26 mmol/L (ref 22–32)
Calcium: 7.9 mg/dL — ABNORMAL LOW (ref 8.9–10.3)
Chloride: 100 mmol/L (ref 98–111)
Creatinine, Ser: 0.76 mg/dL (ref 0.44–1.00)
GFR, Estimated: >60 mL/min (ref >60)
Glucose, Bld: 98 mg/dL (ref 70–99)
Potassium: 3.6 mmol/L (ref 3.5–5.1)
Sodium: 133 mmol/L — ABNORMAL LOW (ref 135–145)

## 2023-03-03 LAB — SARS CORONAVIRUS 2 BY RT PCR: SARS Coronavirus 2 by RT PCR: NEGATIVE

## 2023-03-03 LAB — TROPONIN I (HIGH SENSITIVITY): Troponin I (High Sensitivity): 4 ng/L (ref <18)

## 2023-03-03 MED ORDER — IOHEXOL 300 MG/ML  SOLN
100.0000 mL | Freq: Once | INTRAMUSCULAR | Status: AC | PRN
  Administered 2023-03-03: 100 mL via INTRAVENOUS

## 2023-03-03 MED ORDER — GUAIFENESIN 100 MG/5ML PO LIQD
5.0000 mL | ORAL | Status: AC | PRN
  Administered 2023-03-05 – 2023-03-06 (×4): 5 mL via ORAL
  Filled 2023-03-03 (×4): qty 10

## 2023-03-03 MED ORDER — HYDROCOD POLI-CHLORPHE POLI ER 10-8 MG/5ML PO SUER
5.0000 mL | Freq: Every evening | ORAL | Status: AC | PRN
  Administered 2023-03-04: 5 mL via ORAL
  Filled 2023-03-03: qty 5

## 2023-03-03 MED ORDER — IPRATROPIUM-ALBUTEROL 0.5-2.5 (3) MG/3ML IN SOLN
3.0000 mL | Freq: Once | RESPIRATORY_TRACT | Status: AC
  Administered 2023-03-03: 3 mL via RESPIRATORY_TRACT
  Filled 2023-03-03: qty 3

## 2023-03-03 MED ORDER — SENNOSIDES-DOCUSATE SODIUM 8.6-50 MG PO TABS: 1.0000 | ORAL_TABLET | Freq: Every evening | ORAL | Status: DC | PRN

## 2023-03-03 MED ORDER — ENOXAPARIN SODIUM 40 MG/0.4ML IJ SOSY
40.0000 mg | PREFILLED_SYRINGE | INTRAMUSCULAR | Status: DC
  Administered 2023-03-03 – 2023-03-06 (×4): 40 mg via SUBCUTANEOUS
  Filled 2023-03-03 (×4): qty 0.4

## 2023-03-03 MED ORDER — ACETAMINOPHEN 650 MG RE SUPP: 650.0000 mg | Freq: Four times a day (QID) | RECTAL | Status: DC | PRN

## 2023-03-03 MED ORDER — IPRATROPIUM-ALBUTEROL 0.5-2.5 (3) MG/3ML IN SOLN
3.0000 mL | Freq: Three times a day (TID) | RESPIRATORY_TRACT | Status: AC
  Administered 2023-03-03 – 2023-03-04 (×4): 3 mL via RESPIRATORY_TRACT
  Filled 2023-03-03 (×3): qty 3

## 2023-03-03 MED ORDER — METHYLPREDNISOLONE SODIUM SUCC 40 MG IJ SOLR
40.0000 mg | Freq: Every day | INTRAMUSCULAR | Status: AC
  Administered 2023-03-04: 40 mg via INTRAVENOUS
  Filled 2023-03-03: qty 1

## 2023-03-03 MED ORDER — SODIUM CHLORIDE 0.9 % IV SOLN
500.0000 mg | Freq: Once | INTRAVENOUS | Status: AC
  Administered 2023-03-03: 500 mg via INTRAVENOUS
  Filled 2023-03-03: qty 5

## 2023-03-03 MED ORDER — SODIUM CHLORIDE 0.9 % IV BOLUS
1350.0000 mL | Freq: Once | INTRAVENOUS | Status: AC
  Administered 2023-03-03: 1350 mL via INTRAVENOUS

## 2023-03-03 MED ORDER — SODIUM CHLORIDE 0.9 % IV SOLN: 1.0000 g | Freq: Once | INTRAVENOUS | Status: DC

## 2023-03-03 MED ORDER — ONDANSETRON HCL 4 MG PO TABS: 4.0000 mg | ORAL_TABLET | Freq: Four times a day (QID) | ORAL | Status: DC | PRN

## 2023-03-03 MED ORDER — SODIUM CHLORIDE 0.9 % IV SOLN
500.0000 mg | INTRAVENOUS | Status: DC
  Administered 2023-03-04 – 2023-03-05 (×2): 500 mg via INTRAVENOUS
  Filled 2023-03-03 (×2): qty 5

## 2023-03-03 MED ORDER — SODIUM CHLORIDE 0.9 % IV SOLN
2.0000 g | INTRAVENOUS | Status: AC
  Administered 2023-03-03 – 2023-03-07 (×5): 2 g via INTRAVENOUS
  Filled 2023-03-03 (×6): qty 20

## 2023-03-03 MED ORDER — METHYLPREDNISOLONE SODIUM SUCC 125 MG IJ SOLR
125.0000 mg | Freq: Once | INTRAMUSCULAR | Status: AC
  Administered 2023-03-03: 125 mg via INTRAVENOUS
  Filled 2023-03-03: qty 2

## 2023-03-03 MED ORDER — ACETAMINOPHEN 325 MG PO TABS: 650.0000 mg | ORAL_TABLET | Freq: Four times a day (QID) | ORAL | Status: DC | PRN

## 2023-03-03 MED ORDER — SODIUM CHLORIDE 0.9 % IV BOLUS: 1000.0000 mL | Freq: Once | INTRAVENOUS | Status: DC

## 2023-03-03 MED ORDER — NICOTINE 14 MG/24HR TD PT24: 14.0000 mg | MEDICATED_PATCH | Freq: Every day | TRANSDERMAL | Status: DC | PRN

## 2023-03-03 MED ORDER — ONDANSETRON HCL 4 MG/2ML IJ SOLN: 4.0000 mg | Freq: Four times a day (QID) | INTRAMUSCULAR | Status: DC | PRN

## 2023-03-03 MED ORDER — SODIUM CHLORIDE 0.9 % IV SOLN: INTRAVENOUS | Status: AC

## 2023-03-03 NOTE — Assessment & Plan Note (Addendum)
Patient remained hemodynamically stable with respiration rate of 16, heart rate of 88, blood pressure 103/60, SpO2 of 98% on room air. Second lactic acid level was elevated after the first was normal, query lab error Sodium chloride bolus 1.35 L ordered followed by sodium chloride infusion at 125 mL/h, 1 day ordered Scheduled third lactic acid ordered for 2100-hour on day of admission, discussed with nursing Continue ceftriaxone 2 g IV daily and azithromycin 500 mg IV daily to complete 5-day course

## 2023-03-03 NOTE — Progress Notes (Addendum)
Date and time results received: 03/03/23  (use smartphrase ".now" to insert current time)  Test: lactic acid Critical Value: 2.5  Name of Provider Notified: cox,amy  Orders Received? Or Actions Taken?: MD Ordering additional repeat test

## 2023-03-03 NOTE — Assessment & Plan Note (Addendum)
Patient denies fever, no leukocytosis however given patient is immunosuppressed with HIV status and not being able to afford her HIV medication, we will check lactic acid x 2 Ceftriaxone 2 g IV daily, 5 days ordered Azithromycin we will continue with 500 mg daily, to complete 5-day course Incentive spirometry, flutter valve, every 2 hours while awake Guaifenesin 5 mL p.o. every 4 hours as needed for for cough and to loosen phlegm, 3 days ordered; Tussionex 5 mL p.o. nightly as needed for cough, 2 days ordered Admit to telemetry medical, inpatient

## 2023-03-03 NOTE — Assessment & Plan Note (Deleted)
Causing patient to not be able to afford her HIV medication TOC consulted for medication assistance

## 2023-03-03 NOTE — ED Provider Notes (Signed)
[facility] Provider Note    Event Date/Time   First MD Initiated Contact with Patient 03/03/23 1251     (approximate)   History   Shortness of Breath   HPI  Nachelle Applebaum is a 57 y.o. female with history of HIV, COPD who presents with complaints of shortness of breath, cough and some fatigue.  She reports shortness of breath has developed over the last several days, cough has been ongoing for over a month.  Denies night sweats.  Reports HIV is not well-controlled     Physical Exam   Triage Vital Signs: ED Triage Vitals  Encounter Vitals Group     BP 03/03/23 1236 115/74     Systolic BP Percentile --      Diastolic BP Percentile --      Pulse Rate 03/03/23 1236 (!) 103     Resp 03/03/23 1236 (!) 28     Temp 03/03/23 1236 98.8 F (37.1 C)     Temp Source 03/03/23 1236 Oral     SpO2 03/03/23 1236 94 %     Weight 03/03/23 1235 45.4 kg (100 lb)     Height 03/03/23 1235 1.626 m (5\' 4" )     Head Circumference --      Peak Flow --      Pain Score 03/03/23 1227 0     Pain Loc --      Pain Education --      Exclude from Growth Chart --     Most recent vital signs: Vitals:   03/03/23 1330 03/03/23 1400  BP: 112/63 (!) 107/59  Pulse: 91 88  Resp: (!) 29 (!) 27  Temp:    SpO2: 99% 100%     General: Awake, no distress.  CV:  Good peripheral perfusion.  Resp:  Increased effort with mild tachypnea, wheezing bilaterally Abd:  No distention.  Other:     ED Results / Procedures / Treatments   Labs (all labs ordered are listed, but only abnormal results are displayed) Labs Reviewed  BASIC METABOLIC PANEL - Abnormal; Notable for the following components:      Result Value   Sodium 133 (*)    Calcium 7.9 (*)    All other components within normal limits  CBC - Abnormal; Notable for the following components:   RBC 3.45 (*)    Hemoglobin 10.4 (*)    HCT 31.2 (*)    All other components within normal limits  POC URINE PREG, ED  TROPONIN I  (HIGH SENSITIVITY)     EKG  ED ECG REPORT I, Jene Every, the attending physician, personally viewed and interpreted this ECG.  Date: 03/03/2023  Rhythm: normal sinus rhythm QRS Axis: normal Intervals: normal ST/T Wave abnormalities: normal Narrative Interpretation: no evidence of acute ischemia    RADIOLOGY Chest x-ray viewed interpret by me, possible left lower pneumonia, pending radiology read    PROCEDURES:  Critical Care performed:   Procedures   MEDICATIONS ORDERED IN ED: Medications  cefTRIAXone (ROCEPHIN) 1 g in sodium chloride 0.9 % 100 mL IVPB (has no administration in time range)  azithromycin (ZITHROMAX) 500 mg in sodium chloride 0.9 % 250 mL IVPB (has no administration in time range)  ipratropium-albuterol (DUONEB) 0.5-2.5 (3) MG/3ML nebulizer solution 3 mL (3 mLs Nebulization Given 03/03/23 1335)  ipratropium-albuterol (DUONEB) 0.5-2.5 (3) MG/3ML nebulizer solution 3 mL (3 mLs Nebulization Given 03/03/23 1335)  methylPREDNISolone sodium succinate (SOLU-MEDROL) 125 mg/2 mL injection 125 mg (125 mg Intravenous  Given 03/03/23 1336)  iohexol (OMNIPAQUE) 300 MG/ML solution 100 mL (100 mLs Intravenous Contrast Given 03/03/23 1415)     IMPRESSION / MDM / ASSESSMENT AND PLAN / ED COURSE  I reviewed the triage vital signs and the nursing notes. Patient's presentation is most consistent with acute presentation with potential threat to life or bodily function.  Patient presents with shortness of breath as detailed above, differential includes COPD exacerbation, pneumonia  Wheezing on exam, will treat with DuoNeb, Solu-Medrol, pending labs, chest x-ray  Chest x-ray with possible pneumonia but CT with contrast recommended, pending CT scan  Given concerning chest x-ray, continued shortness of breath, history of HIV will treat with IV Rocephin, azithromycin, have consulted the hospitalist for admission      FINAL CLINICAL IMPRESSION(S) / ED DIAGNOSES   Final  diagnoses:  COPD exacerbation (HCC)  Community acquired pneumonia of left lower lobe of lung     Rx / DC Orders   ED Discharge Orders     None        Note:  This document was prepared using Dragon voice recognition software and may include unintentional dictation errors.   Jene Every, MD 03/03/23 1444

## 2023-03-03 NOTE — Assessment & Plan Note (Signed)
Patient has had a 50 pound weight loss over the last 2 years that was unintentional Patient states she has never had a colonoscopy  Counseled patient on close follow-up with outpatient provider for weight loss and to get her colonoscopy. Patient endorses understanding and compliance

## 2023-03-03 NOTE — Assessment & Plan Note (Signed)
-   As needed nicotine patch ordered ?

## 2023-03-03 NOTE — Hospital Course (Signed)
[name] is a 57 year old female with history of COPD/emphysema, HIV status, not taking HIV medication, who presents to the emergency department for chief concerns of shortness of breath for 2 months.  Vitals in the emergency department showed temperature 98.8, respiration rate of 28, blood pressure 115/74, SpO2 94% on room air, heart rate 103.  Serum sodium is 133, potassium 2.6, chloride 100, bicarb 26, BUN of 10, serum creatinine of 0.76, EGFR greater than 60, nonfasting blood glucose 98, WBC 5.3, hemoglobin 10.4, platelets of 175.  High sensitive troponin is 4.  Chest x-ray 2 views: Left lower lobe reticulonodular opacity.  Differentials consideration include infection, aspiration, atelectasis.  CT chest with contrast: read as dense left lower lobe consolidation consistent with infection or aspiration.  Aortic atherosclerosis.  ED treatment: Solu-Medrol 125 mg IV one-time dose, DuoNebs 3 treatments, azithromycin 500 mg IV, ceftriaxone 1 g IV.

## 2023-03-03 NOTE — ED Triage Notes (Signed)
Pt to ED for SOB since 2 months Pt is coughing, states has this cough since 2 years, "occasional" smoking Hx emphysema and HIV Denies CP Pt is tachypneic and labored

## 2023-03-03 NOTE — H&P (Addendum)
History and Physical   Joy Davis ZOX:[id number] DOB: 01/21/1966 DOA: 03/03/2023  PCP: [facility]  Patient coming from: Home  I have personally briefly reviewed patient's old medical records in [facility] EMR.  Chief Concern: Shortness of breath worsening over 2 months, cough  HPI: [name] is a 57 year old female with history of COPD/emphysema, HIV status, not taking HIV medication, who presents to the emergency department for chief concerns of shortness of breath for 2 months.  Vitals in the emergency department showed temperature 98.8, respiration rate of 28, blood pressure 115/74, SpO2 94% on room air, heart rate 103.  Serum sodium is 133, potassium 2.6, chloride 100, bicarb 26, BUN of 10, serum creatinine of 0.76, EGFR greater than 60, nonfasting blood glucose 98, WBC 5.3, hemoglobin 10.4, platelets of 175.  High sensitive troponin is 4.  Chest x-ray 2 views: Left lower lobe reticulonodular opacity.  Differentials consideration include infection, aspiration, atelectasis.  CT chest with contrast: read as dense left lower lobe consolidation consistent with infection or aspiration.  Aortic atherosclerosis.  ED treatment: Solu-Medrol 125 mg IV one-time dose, DuoNebs 3 treatments, azithromycin 500 mg IV, ceftriaxone 1 g IV. --------------------------- At bedside, she is able to tell me her name, age, current location. She states the year is 2022.   She was sleeping and does not appear to be in acute distress. She reports the cough is not productive.   She states that she does not have difficulty obtaining HIV medication.  She has not taken her HIV medication for the last 7 to 9 months due to when she last called the pharmacy to obtain her medication, the pharmacy stated that the doctor needs to approve the medication.  Patient has not called this doctor back regarding her refilling of HIV medications.  Social history: She lives by herself.  She  infrequently smokes, 1 cigarette 4 days.  She denies EtOH and recreational drug use.  ROS: Constitutional: no weight change, no fever ENT/Mouth: no sore throat, no rhinorrhea Eyes: no eye pain, no vision changes Cardiovascular: no chest pain, + dyspnea,  no edema, no palpitations Respiratory: no cough, no sputum, + wheezing Gastrointestinal: no nausea, no vomiting, no diarrhea, no constipation Genitourinary: no urinary incontinence, no dysuria, no hematuria Musculoskeletal: no arthralgias, no myalgias Skin: no skin lesions, no pruritus, Neuro: + weakness, no loss of consciousness, no syncope Psych: no anxiety, no depression, + decrease appetite Heme/Lymph: no bruising, no bleeding  ED Course: Discussed with emergency medicine provider, patient requiring hospitalization for chief concerns of shortness of breath, left lower lobe pneumonia.  Assessment/Plan  Principal Problem:   Community acquired pneumonia Active Problems:   Emphysema lung (HCC)   HIV (human immunodeficiency virus infection) (HCC)   Sinus tachycardia   Tobacco use   Elevated lactic acid level   Weight loss   Assessment and Plan:  * Community acquired pneumonia Patient denies fever, no leukocytosis however given patient is immunosuppressed with HIV status and not being able to afford her HIV medication, we will check lactic acid x 2 Ceftriaxone 2 g IV daily, 5 days ordered Azithromycin we will continue with 500 mg daily, to complete 5-day course Incentive spirometry, flutter valve, every 2 hours while awake Guaifenesin 5 mL p.o. every 4 hours as needed for for cough and to loosen phlegm, 3 days ordered; Tussionex 5 mL p.o. nightly as needed for cough, 2 days ordered Admit to telemetry medical, inpatient  Weight loss Patient has had a 50 pound  weight loss over the last 2 years that was unintentional Patient states she has never had a colonoscopy  Counseled patient on close follow-up with outpatient provider for  weight loss and to get her colonoscopy. Patient endorses understanding and compliance  Elevated lactic acid level Patient remained hemodynamically stable with respiration rate of 16, heart rate of 88, blood pressure 103/60, SpO2 of 98% on room air. Second lactic acid level was elevated after the first was normal, query lab error Sodium chloride bolus 1.35 L ordered followed by sodium chloride infusion at 125 mL/h, 1 day ordered Scheduled third lactic acid ordered for 2100-hour on day of admission, discussed with nursing Continue ceftriaxone 2 g IV daily and azithromycin 500 mg IV daily to complete 5-day course  Tobacco use As needed nicotine patch ordered  Sinus tachycardia Suspect secondary to left lower lobe pneumonia and a patient with chronic emphysema  HIV (human immunodeficiency virus infection) (HCC) Patient is not taking for HIV due to needing to call the provider in order to get the medication refilled Patient has not been taking her HIV medication for 7 months  Emphysema lung (HCC) COPD With mild exacerbation secondary to left lower lobe pneumonia and right lower lobe posterior mild consolidation Solu-Medrol 40 mg daily, 1 dose ordered for 74/29/2024 DuoNebs 3 times daily Incentive spirometry, flutter valve, 2 8-10 reps every 2 hours while awake, 45 days is recommended  Chart reviewed.   DVT prophylaxis: Enoxaparin 40 mg subcutaneous Code Status: Full code Diet: Regular diet Family Communication: No Disposition Plan: Pending clinical course Consults called: TOC Admission status: Telemetry medical, inpatient  Past Medical History:  Diagnosis Date   COPD (chronic obstructive pulmonary disease) (HCC)    emphysema   HIV (human immunodeficiency virus infection) (HCC) 2022   History reviewed. No pertinent surgical history.  Social History:  reports that she has been smoking cigarettes. She has quit using smokeless tobacco. She reports that she does not currently use  alcohol. She reports that she does not currently use drugs.  No Known Allergies Family History  Problem Relation Age of Onset   Heart attack Mother    Kidney disease Mother    Family history: Family history reviewed and not pertinent  Prior to Admission medications   None   Physical Exam: Vitals:   03/03/23 1236 03/03/23 1330 03/03/23 1400 03/03/23 1604  BP: 115/74 112/63 (!) 107/59 103/60  Pulse: (!) 103 91 88 88  Resp: (!) 28 (!) 29 (!) 27 16  Temp: 98.8 F (37.1 C)     TempSrc: Oral     SpO2: 94% 99% 100% 98%  Weight:      Height:       Constitutional: appears cachectic, NAD, calm Eyes: PERRL, lids and conjunctivae normal ENMT: Mucous membranes are moist. Posterior pharynx clear of any exudate or lesions. Age-appropriate dentition. Hearing appropriate Neck: normal, supple, no masses, no thyromegaly Respiratory: Decreased lung sounds in the left lower lobe. + Diffuse wheezing, no crackles. Normal respiratory effort. No accessory muscle use.  Cardiovascular: Regular rate and rhythm, no murmurs / rubs / gallops. No extremity edema. 2+ pedal pulses. No carotid bruits.  Abdomen: Scaphoid abdomen, no tenderness, no masses palpated, no hepatosplenomegaly. Bowel sounds positive.  Musculoskeletal: no clubbing / cyanosis. No joint deformity upper and lower extremities. Good ROM, no contractures, no atrophy. Normal muscle tone.  Skin: no rashes, lesions, ulcers. No induration Neurologic: Sensation intact. Strength 5/5 in all 4.  Psychiatric: Normal judgment and insight. Alert and oriented  x 3. Normal mood.   EKG: independently reviewed, showing sinus tachycardia with a rate of 101, QTc 401  Chest x-ray on Admission: I personally reviewed and I agree with radiologist reading as below.  CT Chest W Contrast  Result Date: 03/03/2023 CLINICAL DATA:  Short of breath for 2 months, cough, tobacco abuse, history of emphysema and HIV EXAM: CT CHEST WITH CONTRAST TECHNIQUE: Multidetector CT  imaging of the chest was performed during intravenous contrast administration. RADIATION DOSE REDUCTION: This exam was performed according to the departmental dose-optimization program which includes automated exposure control, adjustment of the mA and/or kV according to patient size and/or use of iterative reconstruction technique. CONTRAST:  OMNIPAQUE IOHEXOL 300 MG/ML  SOLN COMPARISON:  03/03/2023 FINDINGS: Cardiovascular: The heart is unremarkable without pericardial effusion. No evidence of thoracic aortic aneurysm or dissection. Atherosclerosis of the aortic arch and descending thoracic aorta. Mediastinum/Nodes: No enlarged mediastinal, hilar, or axillary lymph nodes. Thyroid gland, trachea, and esophagus demonstrate no significant findings. Lungs/Pleura: Upper lobe predominant emphysema. Dense consolidation within the left lower lobe consistent with infection or aspiration. Follow-up to resolution recommended. Minimal dependent consolidation within the right lower lobe posterior costophrenic angle, favor atelectasis. No effusion or pneumothorax. Central airways are patent. There is no abnormality in the right upper lobe to correspond to the chest x-ray finding, which likely reflects summation artifact. Upper Abdomen: No acute abnormality. Musculoskeletal: No acute or destructive bony abnormalities. Reconstructed images demonstrate no additional findings. IMPRESSION: 1. Dense left lower lobe consolidation consistent with infection or aspiration. Follow-up imaging 3-4 weeks after completion of appropriate medical management is recommended to document resolution and exclude underlying neoplasm. 2. Minimal dependent consolidation right lower lobe posterior costophrenic angle, favor atelectasis. 3. Aortic Atherosclerosis (ICD10-I70.0) and Emphysema (ICD10-J43.9). Electronically Signed   By: Sharlet Salina M.D.   On: 03/03/2023 14:47   DG Chest 2 View  Result Date: 03/03/2023 CLINICAL DATA:  SOB EXAM:  CHEST - 2 VIEW COMPARISON:  None Available. FINDINGS: The cardiomediastinal silhouette is normal in contour. No pleural effusion. No pneumothorax. LEFT lower lobe reticulonodular opacity. Faint nodular opacity of the RIGHT upper lateral lung. Visualized abdomen is unremarkable. Mild multilevel degenerative changes of the thoracic spine. IMPRESSION: 1. LEFT lower lobe reticulonodular opacity. Differential consideration include infection, aspiration or atelectasis. Consider further evaluation with dedicated chest CT with contrast versus follow-up PA and lateral chest radiograph in 4 weeks after appropriate treatment. 2. Faint nodular opacity of the RIGHT upper lateral lung. This could reflect a pulmonary nodule versus summation artifact. Attention on follow-up. Electronically Signed   By: Meda Klinefelter M.D.   On: 03/03/2023 12:59    Labs on Admission: I have personally reviewed following labs  CBC: Recent Labs  Lab 03/03/23 1310  WBC 5.7  HGB 10.4*  HCT 31.2*  MCV 90.4  PLT 175   Basic Metabolic Panel: Recent Labs  Lab 03/03/23 1310  NA 133*  K 3.6  CL 100  CO2 26  GLUCOSE 98  BUN 11  CREATININE 0.76  CALCIUM 7.9*   GFR: Estimated Creatinine Clearance: 54.9 mL/min (by C-G formula based on SCr of 0.76 mg/dL).  This document was prepared using Dragon Voice Recognition software and may include unintentional dictation errors.  Dr. Sedalia Muta [facility]  If 7PM-7AM, please contact overnight-coverage provider If 7AM-7PM, please contact day attending provider [email or web address]  03/03/2023, 7:06 PM

## 2023-03-03 NOTE — Assessment & Plan Note (Addendum)
Patient is not taking for HIV due to needing to call the provider in order to get the medication refilled Patient has not been taking her HIV medication for 7 months

## 2023-03-03 NOTE — ED Notes (Signed)
Advised nurse that patient has ready bed 

## 2023-03-03 NOTE — Assessment & Plan Note (Signed)
Suspect secondary to left lower lobe pneumonia and a patient with chronic emphysema

## 2023-03-03 NOTE — Assessment & Plan Note (Signed)
COPD With mild exacerbation secondary to left lower lobe pneumonia and right lower lobe posterior mild consolidation Solu-Medrol 40 mg daily, 1 dose ordered for 74/29/2024 DuoNebs 3 times daily Incentive spirometry, flutter valve, 2 8-10 reps every 2 hours while awake, 45 days is recommended

## 2023-03-04 ENCOUNTER — Other Ambulatory Visit (HOSPITAL_COMMUNITY): Payer: Self-pay

## 2023-03-04 DIAGNOSIS — R7989 Other specified abnormal findings of blood chemistry: Secondary | ICD-10-CM

## 2023-03-04 DIAGNOSIS — B20 Human immunodeficiency virus [HIV] disease: Secondary | ICD-10-CM | POA: Diagnosis not present

## 2023-03-04 DIAGNOSIS — J181 Lobar pneumonia, unspecified organism: Secondary | ICD-10-CM

## 2023-03-04 DIAGNOSIS — J440 Chronic obstructive pulmonary disease with acute lower respiratory infection: Secondary | ICD-10-CM

## 2023-03-04 DIAGNOSIS — R634 Abnormal weight loss: Secondary | ICD-10-CM

## 2023-03-04 DIAGNOSIS — J432 Centrilobular emphysema: Secondary | ICD-10-CM | POA: Diagnosis not present

## 2023-03-04 DIAGNOSIS — D649 Anemia, unspecified: Secondary | ICD-10-CM | POA: Diagnosis not present

## 2023-03-04 DIAGNOSIS — Z720 Tobacco use: Secondary | ICD-10-CM

## 2023-03-04 DIAGNOSIS — F17210 Nicotine dependence, cigarettes, uncomplicated: Secondary | ICD-10-CM

## 2023-03-04 DIAGNOSIS — J189 Pneumonia, unspecified organism: Secondary | ICD-10-CM | POA: Diagnosis not present

## 2023-03-04 LAB — HEPATIC FUNCTION PANEL
ALT: 13 U/L (ref 0–44)
AST: 20 U/L (ref 15–41)
Albumin: 2.6 g/dL — ABNORMAL LOW (ref 3.5–5.0)
Alkaline Phosphatase: 45 U/L (ref 38–126)
Bilirubin, Direct: <0.1 mg/dL (ref 0.0–0.2)
Total Bilirubin: 0.2 mg/dL — ABNORMAL LOW (ref 0.3–1.2)
Total Protein: 7.3 g/dL (ref 6.5–8.1)

## 2023-03-04 LAB — PROCALCITONIN: Procalcitonin: <0.1 ng/mL

## 2023-03-04 LAB — LACTATE DEHYDROGENASE: LDH: 114 U/L (ref 98–192)

## 2023-03-04 MED ORDER — ACETAMINOPHEN 325 MG PO TABS: 650.0000 mg | ORAL_TABLET | Freq: Four times a day (QID) | ORAL | Status: DC | PRN

## 2023-03-04 NOTE — Consult Note (Signed)
NAME: Joy Davis  DOB: Feb 19, 1966  MRN: [id number]  Date/Time: 03/04/2023 3:44 PM  REQUESTING PROVIDER:Dr.Sreeram Subjective:  REASON FOR CONSULT: HIV /pneumonia ? Joy Davis is a 57 y.o. female with a history of HIV, COPD, emphysema presents with sob and cough for a few weeks and is worse for a week Pt says she has had fever for a couple of months - sometimes can be 101 She lives in a oarding house She has not been taking Biktarvy in many months now She was diagnosed with HIV in Sept 2023 She was followed at [facility] center , but because of lack of transport she has not been going- She had a case worker who would take her to the visit but not sure what happened.  She is a smoker and has COPD-  She has lost 50 pounds weight in 1-2 years Poor dentition- broke a few teeth 3 motnhs ago Ex cocaine use    Past Medical History:  Diagnosis Date   COPD (chronic obstructive pulmonary disease) (HCC)    emphysema   HIV (human immunodeficiency virus infection) (HCC) 2022    History reviewed. No pertinent surgical history.  Social History   Socioeconomic History   Marital status: Single    Spouse name: Not on file   Number of children: Not on file   Years of education: Not on file   Highest education level: Not on file  Occupational History   Not on file  Tobacco Use   Smoking status: Some Days    Types: Cigarettes   Smokeless tobacco: Former  Substance and Sexual Activity   Alcohol use: Not Currently   Drug use: Not Currently   Sexual activity: Not Currently  Other Topics Concern   Not on file  Social History Narrative   Not on file   Social Determinants of Health   Financial Resource Strain: Not on file  Food Insecurity: Not on file  Transportation Needs: Not on file  Physical Activity: Not on file  Stress: Not on file  Social Connections: Not on file  Intimate Partner Violence: Not on file    Family History  Problem Relation Age of Onset   Heart  attack Mother    Kidney disease Mother    No Known Allergies I? Current Facility-Administered Medications  Medication Dose Route Frequency Provider Last Rate Last Admin   0.9 %  sodium chloride infusion   Intravenous Continuous Cox, Amy N, DO 125 mL/hr at 03/04/23 0644 New Bag at 03/04/23 0644   acetaminophen (TYLENOL) tablet 650 mg  650 mg Oral Q6H PRN Cox, Amy N, DO       Or   acetaminophen (TYLENOL) suppository 650 mg  650 mg Rectal Q6H PRN Cox, Amy N, DO       azithromycin (ZITHROMAX) 500 mg in sodium chloride 0.9 % 250 mL IVPB  500 mg Intravenous Q24H Cox, Amy N, DO 250 mL/hr at 03/04/23 0956 500 mg at 03/04/23 0956   cefTRIAXone (ROCEPHIN) 2 g in sodium chloride 0.9 % 100 mL IVPB  2 g Intravenous Q24H Cox, Amy N, DO 200 mL/hr at 03/03/23 1718 2 g at 03/03/23 1718   chlorpheniramine-HYDROcodone (TUSSIONEX) 10-8 MG/5ML suspension 5 mL  5 mL Oral QHS PRN Cox, Amy N, DO   5 mL at 03/04/23 0952   enoxaparin (LOVENOX) injection 40 mg  40 mg Subcutaneous Q24H Cox, Amy N, DO   40 mg at 03/03/23 2211   guaiFENesin (ROBITUSSIN) 100 MG/5ML liquid 5 mL  5 mL Oral Q4H PRN Cox, Amy N, DO       ipratropium-albuterol (DUONEB) 0.5-2.5 (3) MG/3ML nebulizer solution 3 mL  3 mL Nebulization TID Cox, Amy N, DO   3 mL at 03/04/23 1328   nicotine (NICODERM CQ - dosed in mg/24 hours) patch 14 mg  14 mg Transdermal Daily PRN Cox, Amy N, DO       ondansetron (ZOFRAN) tablet 4 mg  4 mg Oral Q6H PRN Cox, Amy N, DO       Or   ondansetron (ZOFRAN) injection 4 mg  4 mg Intravenous Q6H PRN Cox, Amy N, DO       senna-docusate (Senokot-S) tablet 1 tablet  1 tablet Oral QHS PRN Cox, Amy N, DO         Abtx:  Anti-infectives (From admission, onward)    Start     Dose/Rate Route Frequency Ordered Stop   03/04/23 1000  azithromycin (ZITHROMAX) 500 mg in sodium chloride 0.9 % 250 mL IVPB        500 mg 250 mL/hr over 60 Minutes Intravenous Every 24 hours 03/03/23 1503 03/08/23 0959   03/03/23 1600  cefTRIAXone  (ROCEPHIN) 2 g in sodium chloride 0.9 % 100 mL IVPB        2 g 200 mL/hr over 30 Minutes Intravenous Every 24 hours 03/03/23 1503 03/08/23 1559   03/03/23 1445  cefTRIAXone (ROCEPHIN) 1 g in sodium chloride 0.9 % 100 mL IVPB  Status:  Discontinued        1 g 200 mL/hr over 30 Minutes Intravenous  Once 03/03/23 1439 03/03/23 1503   03/03/23 1445  azithromycin (ZITHROMAX) 500 mg in sodium chloride 0.9 % 250 mL IVPB        500 mg 250 mL/hr over 60 Minutes Intravenous  Once 03/03/23 1439 03/03/23 1715       REVIEW OF SYSTEMS:  Const:  fever,  sweats negative chills, ++ weight loss Eyes: negative diplopia or visual changes, negative eye pain ENT: negative coryza, negative sore throat Resp: + cough, yellow sputum dyspnea Cards: negative for chest pain, palpitations, lower extremity edema GU: negative for frequency, dysuria and hematuria GI: Negative for abdominal pain, diarrhea, bleeding, constipation Skin: negative for rash and pruritus Heme: negative for easy bruising and gum/nose bleeding MS: general weakness Neurolo:negative for headaches, dizziness, vertigo, memory problems  Psych:  anxiety, depression  Endocrine: negative for thyroid, diabetes Allergy/Immunology- negative for any medication or food allergies ?  Objective:  VITALS:  BP 120/89   Pulse 89   Temp 97.8 F (36.6 C)   Resp 16   Ht 5\' 4"  (1.626 m)   Wt 45.4 kg   LMP  (Approximate)   SpO2 98%   BMI 17.16 kg/m   PHYSICAL EXAM:  General: Alert, cooperative, no distress, emaciated Head: Normocephalic, without obvious abnormality, atraumatic. Eyes: Conjunctivae clear, anicteric sclerae. Pupils are equal ENT Nares normal. No drainage or sinus tenderness. Lips, mucosa, and tongue normal. No Thrush Bad dentition Neck: Supple, symmetrical, no adenopathy, thyroid: non tender no carotid bruit and no JVD. Back: No CVA tenderness. Lungs: b/l air entry Decreased bases Heart: Regular rate and rhythm, no murmur, rub  or gallop. Abdomen: Soft, non-tender,not distended. Bowel sounds normal. No masses Extremities: atraumatic, no cyanosis. No edema. No clubbing Skin: No rashes or lesions. Or bruising Lymph: Cervical, supraclavicular normal. Neurologic: Grossly non-focal Pertinent Labs Lab Results CBC    Component Value Date/Time   WBC 3.1 (L) 03/04/2023 4098  RBC 3.04 (L) 03/04/2023 0516   HGB 9.4 (L) 03/04/2023 0516   HCT 27.2 (L) 03/04/2023 0516   PLT 199 03/04/2023 0516   MCV 89.5 03/04/2023 0516   MCH 30.9 03/04/2023 0516   MCHC 34.6 03/04/2023 0516   RDW 14.4 03/04/2023 0516       Latest Ref Rng & Units 03/04/2023    5:16 AM 03/03/2023    1:10 PM  CMP  Glucose 70 - 99 mg/dL 401  98   BUN 6 - 20 mg/dL 16  11   Creatinine 0.27 - 1.00 mg/dL 2.53  6.64   Sodium 403 - 145 mmol/L 138  133   Potassium 3.5 - 5.1 mmol/L 4.0  3.6   Chloride 98 - 111 mmol/L 109  100   CO2 22 - 32 mmol/L 23  26   Calcium 8.9 - 10.3 mg/dL 7.8  7.9       Microbiology: Recent Results (from the past 240 hour(s))  SARS Coronavirus 2 by RT PCR (hospital order, performed in [facility] hospital lab) *cepheid single result test* Anterior Nasal Swab     Status: None   Collection Time: 03/03/23  6:22 PM   Specimen: Anterior Nasal Swab  Result Value Ref Range Status   SARS Coronavirus 2 by RT PCR NEGATIVE NEGATIVE Final    Comment: (NOTE) SARS-CoV-2 target nucleic acids are NOT DETECTED.  The SARS-CoV-2 RNA is generally detectable in upper and lower respiratory specimens during the acute phase of infection. The lowest concentration of SARS-CoV-2 viral copies this assay can detect is 250 copies / mL. A negative result does not preclude SARS-CoV-2 infection and should not be used as the sole basis for treatment or other patient management decisions.  A negative result may occur with improper specimen collection / handling, submission of specimen other than nasopharyngeal swab, presence of viral mutation(s) within  the areas targeted by this assay, and inadequate number of viral copies (<250 copies / mL). A negative result must be combined with clinical observations, patient history, and epidemiological information.  Fact Sheet for Patients:   RoadLapTop.co.za  Fact Sheet for Healthcare Providers: http://kim-miller.com/  This test is not yet approved or  cleared by the [place] FDA and has been authorized for detection and/or diagnosis of SARS-CoV-2 by FDA under an Emergency Use Authorization (EUA).  This EUA will remain in effect (meaning this test can be used) for the duration of the COVID-19 declaration under Section 564(b)(1) of the Act, 21 U.S.C. section 360bbb-3(b)(1), unless the authorization is terminated or revoked sooner.  Performed at [facility], [address] [place], [place] [address]   Respiratory (~20 pathogens) panel by PCR     Status: None   Collection Time: 03/03/23  6:22 PM   Specimen: Nasopharyngeal Swab; Respiratory  Result Value Ref Range Status   Adenovirus NOT DETECTED NOT DETECTED Final   Coronavirus 229E NOT DETECTED NOT DETECTED Final    Comment: (NOTE) The Coronavirus on the Respiratory Panel, DOES NOT test for the novel  Coronavirus (2019 nCoV)    Coronavirus HKU1 NOT DETECTED NOT DETECTED Final   Coronavirus NL63 NOT DETECTED NOT DETECTED Final   Coronavirus OC43 NOT DETECTED NOT DETECTED Final   Metapneumovirus NOT DETECTED NOT DETECTED Final   Rhinovirus / Enterovirus NOT DETECTED NOT DETECTED Final   Influenza A NOT DETECTED NOT DETECTED Final   Influenza B NOT DETECTED NOT DETECTED Final   Parainfluenza Virus 1 NOT DETECTED NOT DETECTED Final   Parainfluenza  Virus 2 NOT DETECTED NOT DETECTED Final   Parainfluenza Virus 3 NOT DETECTED NOT DETECTED Final   Parainfluenza Virus 4 NOT DETECTED NOT DETECTED Final   Respiratory Syncytial Virus NOT DETECTED NOT DETECTED Final   Bordetella  pertussis NOT DETECTED NOT DETECTED Final   Bordetella Parapertussis NOT DETECTED NOT DETECTED Final   Chlamydophila pneumoniae NOT DETECTED NOT DETECTED Final   Mycoplasma pneumoniae NOT DETECTED NOT DETECTED Final    Comment: Performed at [facility], [address] [place], [place] [address]    IMAGING RESULTS:  I have personally reviewed the films Left lower lobe consolidation ? Impression/Recommendation ? 57 year old female with HIV unknown CD4 and viral load, apparently followed at [facility] and was on [place] but has not been taking it for many months now and has not followed up with them presents with shortness of breath and cough of 2 months duration worse recently and also complaining of fever for the last 2 months. Patient also has a weight loss of nearly 50 pounds in the past year to 2  Left lower lobe consolidation Even though this likely is bacterial pneumonia need to rule out tuberculosis especially with the symptoms of fever, cough and shortness of breath of 2 months duration. 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 ___________________________________________________ Discussed with patient, requesting provider Note:  This document was prepared using Dragon voice recognition software and may include unintentional dictation errors.

## 2023-03-04 NOTE — Plan of Care (Signed)
  Problem: Clinical Measurements: Goal: Will remain free from infection Outcome: Progressing Goal: Respiratory complications will improve Outcome: Progressing   Problem: Activity: Goal: Risk for activity intolerance will decrease Outcome: Progressing   Problem: Safety: Goal: Ability to remain free from injury will improve Outcome: Progressing   

## 2023-03-04 NOTE — Progress Notes (Signed)
Pt educated and sputum cup left at bedside for sample when pt can expectorate.

## 2023-03-04 NOTE — Plan of Care (Signed)

## 2023-03-04 NOTE — Discharge Instructions (Signed)
Medicaid Transportation [employer] Transportation coordinator [phone]  Transportation Resources  Agency Name: [facility] Agency Address: [address] [place], [place] [address] Phone: [phone] Email: [email or web address] Website: [email or web address] Service(s) Offered: Housing services, self-sufficiency, congregate meal program, weatherization program, Field seismologist program, emergency food assistance,  housing counseling, home ownership program, wheels-towork program.  Agency Name: [facility] [employer] [phone]) Address: [address], [place], [place] [address] Phone: [phone] Website: [email or web address] Service(s) Offered: Transportation for [employer], subscription and demand response; Dial-a-Ride for citizens 61 years of age or older.  Agency Name: Department of Social Services Address: [address] [place], [place] [address] Phone: [phone] Service(s) Offered: Child support services; child welfare services; food stamps; Medicaid; work first family assistance; and aid with fuel,  rent, food and medicine, transportation assistance.  Agency Name: Disabled [employer] ([employer]  Network Phone: [phone] Service(s) Offered: Transports veterans to the [facility] medical center. Call  forty-eight hours in advance and leave the name, telephone  number, date, and time of appointment. Veteran will be  contacted by the driver the day before the appointment to  arrange a pick up point

## 2023-03-04 NOTE — TOC Progression Note (Signed)
Transition of Care [facility]) - Progression Note    Patient Details  Name: Joy Davis MRN: [id number] Date of Birth: 01/10/1966  Transition [facility]) CM/SW Contact  Marlowe Sax, RN Phone Number: 03/04/2023, 11:11 AM  Clinical Narrative:    Spoke with the patient to discuss DC plan an needs She stated that she generally lives alone She is independent at home    Expected Discharge Plan: Home/Self Care Barriers to Discharge: No Barriers Identified  Expected Discharge Plan and Services   Discharge Planning Services: CM Consult   Living arrangements for the past 2 months: Single Family Home                 DME Arranged: N/A DME Agency: NA         HH Agency: NA         Social Determinants of Health (SDOH) Interventions SDOH Screenings   Tobacco Use: High Risk (03/03/2023)    Readmission Risk Interventions     No data to display

## 2023-03-04 NOTE — TOC Benefit Eligibility Note (Signed)
Pharmacy Patient Advocate Encounter  Insurance verification completed.    The patient is insured through [employer]    Ran test claim for [place] and the current 30 day co-pay is $0.00.   This test claim was processed through [facility]- copay amounts may vary at other pharmacies due to pharmacy/plan contracts, or as the patient moves through the different stages of their insurance plan.

## 2023-03-04 NOTE — Progress Notes (Signed)
Progress Note   Patient: Joy Davis WUJ:[id number] DOB: 06-23-1966 DOA: 03/03/2023     1 DOS: the patient was seen and examined on 03/04/2023   Brief hospital course: Joy Davis is a 57 year old female with history of COPD/emphysema, HIV status, not taking HIV medication, who presents to the emergency department for chief concerns of shortness of breath for 2 months.  Vitals in the emergency department showed temperature 98.8, respiration rate of 28, blood pressure 115/74, SpO2 94% on room air, heart rate 103.  Serum sodium is 133, potassium 2.6, chloride 100, bicarb 26, BUN of 10, serum creatinine of 0.76, EGFR greater than 60, nonfasting blood glucose 98, WBC 5.3, hemoglobin 10.4, platelets of 175.  High sensitive troponin is 4.  Chest x-ray 2 views: Left lower lobe reticulonodular opacity.  Differentials consideration include infection, aspiration, atelectasis.  CT chest with contrast: read as dense left lower lobe consolidation consistent with infection or aspiration.  Aortic atherosclerosis.  ED treatment: Solu-Medrol 125 mg IV one-time dose, DuoNebs 3 treatments, azithromycin 500 mg IV, ceftriaxone 1 g IV.  Patient is admitted to the hospitalist service with impression of community-acquired pneumonia given she is immunocompromised with HIV status, not compliant with medications.  Assessment and Plan: * Community acquired pneumonia Patient is immunosuppressed with HIV status and not compliant with her medication, continue Ceftriaxone 2 g IV daily and azithromycin for 5-day course Encourage intensive monitoring. Will check CD4,, HIV viral load. ID evaluation called for further management. Continue antitussives as needed.   Encourage out of bed to chair, incentive spirometry.  Weight loss Patient endorses significant weight loss since last few months. This could be due to her chronic HIV not compliant with meds. Rule out malignancy-advised outpatient PCP follow-up for  screening colonoscopy.  Elevated lactic acid level Patient lactic acid normal status post IV hydration.  Emphysema lung (HCC) COPD With mild exacerbation secondary to left lower lobe pneumonia and right lower lobe posterior mild consolidation Solu-Medrol 40 mg daily, 1 dose ordered for 74/29/2024 DuoNebs 3 times daily Incentive spirometry, flutter valve, 2 8-10 reps every 2 hours while awake, 45 days is recommended  Tobacco use Advised smoking cessation, continue nicotine patch. Patient will continue on bronchodilator, supplemental oxygen as needed.  HIV (human immunodeficiency virus infection) (HCC) Patient has not been taking her HIV medication for 7 months. Will get ID evaluation. Advised compliance with outpatient follow up.      Subjective: Patient is seen and examined today morning.  States she feels short of breath, weak.  Patient states she lost weight, appetite normal.  Has dry cough.  Not taking her HIV medications.  Physical Exam: Vitals:   03/03/23 1604 03/03/23 2048 03/04/23 0112 03/04/23 0817  BP: 103/60  111/69 114/64  Pulse: 88  72 67  Resp: 16  20 14   Temp:   97.8 F (36.6 C) 97.6 F (36.4 C)  TempSrc:      SpO2: 98% 98% 97% 100%  Weight:      Height:       General - Middle aged Caucasian thin built female, mild respiratory distress HEENT - PERRLA, EOMI, atraumatic head, non tender sinuses. Lung - Clear, diffuse Rales, bibasilar rhonchi Heart - S1, S2 heard, no murmurs, rubs, trace pedal edema Neuro - Alert, awake and oriented x 3, non focal exam. Skin - Warm and dry. Data Reviewed:     Latest Ref Rng & Units 03/04/2023    5:16 AM 03/03/2023    1:10 PM  CBC  WBC 4.0 - 10.5 K/uL 3.1  5.7   Hemoglobin 12.0 - 15.0 g/dL 9.4  16.1   Hematocrit 36.0 - 46.0 % 27.2  31.2   Platelets 150 - 400 K/uL 199  175        Latest Ref Rng & Units 03/04/2023    5:16 AM 03/03/2023    1:10 PM  BMP  Glucose 70 - 99 mg/dL 096  98   BUN 6 - 20 mg/dL 16  11    Creatinine 0.45 - 1.00 mg/dL 4.09  8.11   Sodium 914 - 145 mmol/L 138  133   Potassium 3.5 - 5.1 mmol/L 4.0  3.6   Chloride 98 - 111 mmol/L 109  100   CO2 22 - 32 mmol/L 23  26   Calcium 8.9 - 10.3 mg/dL 7.8  7.9    CT Chest W Contrast  Result Date: 03/03/2023 CLINICAL DATA:  Short of breath for 2 months, cough, tobacco abuse, history of emphysema and HIV EXAM: CT CHEST WITH CONTRAST TECHNIQUE: Multidetector CT imaging of the chest was performed during intravenous contrast administration. RADIATION DOSE REDUCTION: This exam was performed according to the departmental dose-optimization program which includes automated exposure control, adjustment of the mA and/or kV according to patient size and/or use of iterative reconstruction technique. CONTRAST:  OMNIPAQUE IOHEXOL 300 MG/ML  SOLN COMPARISON:  03/03/2023 FINDINGS: Cardiovascular: The heart is unremarkable without pericardial effusion. No evidence of thoracic aortic aneurysm or dissection. Atherosclerosis of the aortic arch and descending thoracic aorta. Mediastinum/Nodes: No enlarged mediastinal, hilar, or axillary lymph nodes. Thyroid gland, trachea, and esophagus demonstrate no significant findings. Lungs/Pleura: Upper lobe predominant emphysema. Dense consolidation within the left lower lobe consistent with infection or aspiration. Follow-up to resolution recommended. Minimal dependent consolidation within the right lower lobe posterior costophrenic angle, favor atelectasis. No effusion or pneumothorax. Central airways are patent. There is no abnormality in the right upper lobe to correspond to the chest x-ray finding, which likely reflects summation artifact. Upper Abdomen: No acute abnormality. Musculoskeletal: No acute or destructive bony abnormalities. Reconstructed images demonstrate no additional findings. IMPRESSION: 1. Dense left lower lobe consolidation consistent with infection or aspiration. Follow-up imaging 3-4 weeks after  completion of appropriate medical management is recommended to document resolution and exclude underlying neoplasm. 2. Minimal dependent consolidation right lower lobe posterior costophrenic angle, favor atelectasis. 3. Aortic Atherosclerosis (ICD10-I70.0) and Emphysema (ICD10-J43.9). Electronically Signed   By: Sharlet Salina M.D.   On: 03/03/2023 14:47   DG Chest 2 View  Result Date: 03/03/2023 CLINICAL DATA:  SOB EXAM: CHEST - 2 VIEW COMPARISON:  None Available. FINDINGS: The cardiomediastinal silhouette is normal in contour. No pleural effusion. No pneumothorax. LEFT lower lobe reticulonodular opacity. Faint nodular opacity of the RIGHT upper lateral lung. Visualized abdomen is unremarkable. Mild multilevel degenerative changes of the thoracic spine. IMPRESSION: 1. LEFT lower lobe reticulonodular opacity. Differential consideration include infection, aspiration or atelectasis. Consider further evaluation with dedicated chest CT with contrast versus follow-up PA and lateral chest radiograph in 4 weeks after appropriate treatment. 2. Faint nodular opacity of the RIGHT upper lateral lung. This could reflect a pulmonary nodule versus summation artifact. Attention on follow-up. Electronically Signed   By: Meda Klinefelter M.D.   On: 03/03/2023 12:59     Family Communication: Patient understands and agree with current plan.  Disposition: Status is: Inpatient Remains inpatient appropriate because: IV antibiotics, further work up of pneumonia, PT evaluation.  Planned Discharge Destination: Home    Time  spent: 44 minutes  Author: Marcelino Duster, MD 03/04/2023 3:11 PM  For on call review [email or web address].

## 2023-03-05 DIAGNOSIS — J189 Pneumonia, unspecified organism: Secondary | ICD-10-CM | POA: Diagnosis not present

## 2023-03-05 DIAGNOSIS — B20 Human immunodeficiency virus [HIV] disease: Secondary | ICD-10-CM | POA: Diagnosis not present

## 2023-03-05 DIAGNOSIS — R7989 Other specified abnormal findings of blood chemistry: Secondary | ICD-10-CM | POA: Diagnosis not present

## 2023-03-05 DIAGNOSIS — J432 Centrilobular emphysema: Secondary | ICD-10-CM | POA: Diagnosis not present

## 2023-03-05 MED ORDER — SULFAMETHOXAZOLE-TRIMETHOPRIM 400-80 MG PO TABS
1.0000 | ORAL_TABLET | Freq: Every day | ORAL | Status: DC
  Administered 2023-03-06 – 2023-03-08 (×3): 1 via ORAL
  Filled 2023-03-05 (×3): qty 1

## 2023-03-05 MED ORDER — AZITHROMYCIN 500 MG PO TABS
500.0000 mg | ORAL_TABLET | Freq: Every day | ORAL | Status: AC
  Administered 2023-03-06 – 2023-03-07 (×2): 500 mg via ORAL
  Filled 2023-03-05 (×2): qty 1

## 2023-03-05 MED ORDER — BICTEGRAVIR-EMTRICITAB-TENOFOV 50-200-25 MG PO TABS
1.0000 | ORAL_TABLET | Freq: Every day | ORAL | Status: DC
  Administered 2023-03-06 – 2023-03-08 (×3): 1 via ORAL
  Filled 2023-03-05 (×3): qty 1

## 2023-03-05 NOTE — Progress Notes (Signed)
Progress Note   Patient: Joy Davis RUE:[id number] DOB: 12-Apr-1966 DOA: 03/03/2023     2 DOS: the patient was seen and examined on 03/05/2023   Brief hospital course: [name] is a 57 year old female with history of COPD/emphysema, HIV status, not taking HIV medication, who presents to the emergency department for chief concerns of shortness of breath, cough for 2 months, occasional fevers.  Patient is admitted to the hospitalist service with impression of community-acquired pneumonia given she is immunocompromised with HIV status, not compliant with medications.  Assessment and Plan: * Community acquired pneumonia Patient is immunosuppressed with HIV status and not compliant with her medication, continue ceftriaxone 2 g IV daily and azithromycin for 5-day course Encourage intensive spirometry ID evaluation appreciated, given her HIV status, weight loss, fever and cough advised to rule out TB.  She will was placed on a airborne isolation, QuantiFERON, sputum samples will be ordered. Continue antitussives as needed.   Encourage out of bed to chair, incentive spirometry.  Elevated lactic acid level Patient lactic acid normal status post IV hydration.  Emphysema lung (HCC) With mild exacerbation secondary to left lower lobe pneumonia and right lower lobe posterior mild consolidation, steroids DuoNebs 3 times daily Incentive spirometry, flutter valve, 2 8-10 reps every 2 hours while awake, 45 days is recommended  Tobacco use Advised smoking cessation, continue nicotine patch. Patient will continue on bronchodilator, supplemental oxygen as needed.  HIV (human immunodeficiency virus infection) (HCC) ID evaluation appreciated, however, CD4 count from prior records to be obtained. Viral loads, CD4 pending.   Outpatient HIV clinic follow-up suggested.      Subjective: Patient is seen and examined today morning.  She is having severe cough.  Unable to talk without cough.   States she is eating fair.  Advised out of bed, incentive spirometry.  Physical Exam: Vitals:   03/04/23 1948 03/05/23 0002 03/05/23 0857 03/05/23 1000  BP:  122/86 (!) 150/119 130/68  Pulse:  89 80   Resp:  18 18   Temp:  97.6 F (36.4 C) (!) 97.3 F (36.3 C)   TempSrc:  Oral    SpO2: 98% 99% 100%   Weight:      Height:       General - Middle aged Caucasian thin built female, mild respiratory distress, severe cough HEENT - PERRLA, EOMI, atraumatic head, non tender sinuses. Lung - Clear, diffuse Rales, bibasilar rhonchi Heart - S1, S2 heard, no murmurs, rubs, trace pedal edema Neuro - Alert, awake and oriented x 3, non focal exam. Skin - Warm and dry. Data Reviewed:     Latest Ref Rng & Units 03/04/2023    3:07 PM 03/04/2023    5:16 AM 03/03/2023    1:10 PM  CBC  WBC 3.4 - 10.8 x10E3/uL 3.8  3.1  5.7   Hemoglobin 11.1 - 15.9 g/dL 9.3  9.4  14.7   Hematocrit 34.0 - 46.6 % 27.0  27.2  31.2   Platelets 150 - 450 x10E3/uL 113  199  175        Latest Ref Rng & Units 03/04/2023    5:16 AM 03/03/2023    1:10 PM  BMP  Glucose 70 - 99 mg/dL 829  98   BUN 6 - 20 mg/dL 16  11   Creatinine 5.62 - 1.00 mg/dL 1.30  8.65   Sodium 784 - 145 mmol/L 138  133   Potassium 3.5 - 5.1 mmol/L 4.0  3.6   Chloride 98 - 111 mmol/L  109  100   CO2 22 - 32 mmol/L 23  26   Calcium 8.9 - 10.3 mg/dL 7.8  7.9    CT Chest W Contrast  Result Date: 03/03/2023 CLINICAL DATA:  Short of breath for 2 months, cough, tobacco abuse, history of emphysema and HIV EXAM: CT CHEST WITH CONTRAST TECHNIQUE: Multidetector CT imaging of the chest was performed during intravenous contrast administration. RADIATION DOSE REDUCTION: This exam was performed according to the departmental dose-optimization program which includes automated exposure control, adjustment of the mA and/or kV according to patient size and/or use of iterative reconstruction technique. CONTRAST:  OMNIPAQUE IOHEXOL 300 MG/ML  SOLN COMPARISON:   03/03/2023 FINDINGS: Cardiovascular: The heart is unremarkable without pericardial effusion. No evidence of thoracic aortic aneurysm or dissection. Atherosclerosis of the aortic arch and descending thoracic aorta. Mediastinum/Nodes: No enlarged mediastinal, hilar, or axillary lymph nodes. Thyroid gland, trachea, and esophagus demonstrate no significant findings. Lungs/Pleura: Upper lobe predominant emphysema. Dense consolidation within the left lower lobe consistent with infection or aspiration. Follow-up to resolution recommended. Minimal dependent consolidation within the right lower lobe posterior costophrenic angle, favor atelectasis. No effusion or pneumothorax. Central airways are patent. There is no abnormality in the right upper lobe to correspond to the chest x-ray finding, which likely reflects summation artifact. Upper Abdomen: No acute abnormality. Musculoskeletal: No acute or destructive bony abnormalities. Reconstructed images demonstrate no additional findings. IMPRESSION: 1. Dense left lower lobe consolidation consistent with infection or aspiration. Follow-up imaging 3-4 weeks after completion of appropriate medical management is recommended to document resolution and exclude underlying neoplasm. 2. Minimal dependent consolidation right lower lobe posterior costophrenic angle, favor atelectasis. 3. Aortic Atherosclerosis (ICD10-I70.0) and Emphysema (ICD10-J43.9). Electronically Signed   By: Sharlet Salina M.D.   On: 03/03/2023 14:47   DG Chest 2 View  Result Date: 03/03/2023 CLINICAL DATA:  SOB EXAM: CHEST - 2 VIEW COMPARISON:  None Available. FINDINGS: The cardiomediastinal silhouette is normal in contour. No pleural effusion. No pneumothorax. LEFT lower lobe reticulonodular opacity. Faint nodular opacity of the RIGHT upper lateral lung. Visualized abdomen is unremarkable. Mild multilevel degenerative changes of the thoracic spine. IMPRESSION: 1. LEFT lower lobe reticulonodular opacity.  Differential consideration include infection, aspiration or atelectasis. Consider further evaluation with dedicated chest CT with contrast versus follow-up PA and lateral chest radiograph in 4 weeks after appropriate treatment. 2. Faint nodular opacity of the RIGHT upper lateral lung. This could reflect a pulmonary nodule versus summation artifact. Attention on follow-up. Electronically Signed   By: Meda Klinefelter M.D.   On: 03/03/2023 12:59     Family Communication: Patient understands and agree with current plan.  Disposition: Status is: Inpatient Remains inpatient appropriate because: IV antibiotics, further work up of pneumonia, ID evaluation, PT  Planned Discharge Destination: Home    Time spent: 44 minutes  Author: Marcelino Duster, MD 03/05/2023 2:53 PM  For on call review [email or web address].

## 2023-03-05 NOTE — Evaluation (Signed)
Physical Therapy Evaluation & Discharge Patient Details Name: Joy Davis MRN: [id number] DOB: [birth year] Today's Date: 03/05/2023  History of Present Illness  57 y/o female presented to ED on 03/03/23 for SOB x 2 months with coughing. Found to have L lower lobe pneumonia. PMH: emphysema, HIV, COPD  Clinical Impression  Patient admitted with the above. PTA, patient lives in a boarding house with roommates and was completely independent. Patient currently functioning at baseline level with no AD. Encouraged use of flutter valve and incentive spirometer with patient able to demonstrate understanding of use of each. No further skilled PT needs identified acutely. Will sign off.         If plan is discharge home, recommend the following:     Can travel by private vehicle        Equipment Recommendations None recommended by PT  Recommendations for Other Services       Functional Status Assessment Patient has not had a recent decline in their functional status     Precautions / Restrictions Precautions Precautions: None Restrictions Weight Bearing Restrictions: No      Mobility  Bed Mobility Overal bed mobility: Independent                  Transfers Overall transfer level: Independent                      Ambulation/Gait Ambulation/Gait assistance: Independent Gait Distance (Feet): 60 Feet (within room due to isolation precautions) Assistive device: None Gait Pattern/deviations: WFL(Within Functional Limits)          Stairs            Wheelchair Mobility     Tilt Bed    Modified Rankin (Stroke Patients Only)       Balance Overall balance assessment: Independent                                           Pertinent Vitals/Pain Pain Assessment Pain Assessment: No/denies pain    Home Living Family/patient expects to be discharged to:: Private residence (boarding house) Living Arrangements: Other relatives   Type  of Home: House Home Access: Stairs to enter   Secretary/administrator of Steps: 3-4 Alternate Level Stairs-Number of Steps: flight Home Layout: Two level;Bed/bath upstairs        Prior Function Prior Level of Function : Independent/Modified Independent                     Hand Dominance        Extremity/Trunk Assessment   Upper Extremity Assessment Upper Extremity Assessment: Overall WFL for tasks assessed    Lower Extremity Assessment Lower Extremity Assessment: Overall WFL for tasks assessed    Cervical / Trunk Assessment Cervical / Trunk Assessment: Normal  Communication   Communication: No difficulties  Cognition Arousal/Alertness: Awake/alert Behavior During Therapy: WFL for tasks assessed/performed Overall Cognitive Status: Within Functional Limits for tasks assessed                                          General Comments      Exercises     Assessment/Plan    PT Assessment Patient does not need any further PT services  PT Problem List  PT Treatment Interventions      PT Goals (Current goals can be found in the Care Plan section)  Acute Rehab PT Goals Patient Stated Goal: to go home PT Goal Formulation: All assessment and education complete, DC therapy    Frequency       Co-evaluation               AM-PAC PT "6 Clicks" Mobility  Outcome Measure Help needed turning from your back to your side while in a flat bed without using bedrails?: None Help needed moving from lying on your back to sitting on the side of a flat bed without using bedrails?: None Help needed moving to and from a bed to a chair (including a wheelchair)?: None Help needed standing up from a chair using your arms (e.g., wheelchair or bedside chair)?: None Help needed to walk in hospital room?: None Help needed climbing 3-5 steps with a railing? : None 6 Click Score: 24    End of Session   Activity Tolerance: Patient tolerated  treatment well Patient left: in bed;with call bell/phone within reach Nurse Communication: Mobility status PT Visit Diagnosis: Muscle weakness (generalized) (M62.81)    Time: 7829-5621 PT Time Calculation (min) (ACUTE ONLY): 9 min   Charges:   PT Evaluation $PT Eval Low Complexity: 1 Low   PT General Charges $$ ACUTE PT VISIT: 1 Visit         Maylon Peppers, PT, DPT Physical Therapist - [facility]  [facility]   Arrow Emmerich A Amiria Orrison 03/05/2023, 9:02 AM

## 2023-03-05 NOTE — Progress Notes (Signed)
Date of Admission:  03/03/2023      ID: Joy Davis is a 57 y.o. female  Principal Problem:   Community acquired pneumonia Active Problems:   Emphysema lung (HCC)   HIV (human immunodeficiency virus infection) (HCC)   Sinus tachycardia   Tobacco use   Elevated lactic acid level   Weight loss    Subjective: Pt feeling better Cough improving No fever  Medications:   [START ON 03/06/2023] azithromycin  500 mg Oral Daily   enoxaparin (LOVENOX) injection  40 mg Subcutaneous Q24H    Objective: Vital signs in last 24 hours: Patient Vitals for the past 24 hrs:  BP Temp Temp src Pulse Resp SpO2  03/05/23 1545 112/73 -- -- -- -- --  03/05/23 1540 (!) 117/102 98.7 F (37.1 C) Oral 77 16 100 %  03/05/23 1000 130/68 -- -- -- -- --  03/05/23 0857 (!) 150/119 (!) 97.3 F (36.3 C) -- 80 18 100 %  03/05/23 0002 122/86 97.6 F (36.4 C) Oral 89 18 99 %  03/04/23 1948 -- -- -- -- -- 98 %     Lab Results    Latest Ref Rng & Units 03/04/2023    3:07 PM 03/04/2023    5:16 AM 03/03/2023    1:10 PM  CBC  WBC 3.4 - 10.8 x10E3/uL 3.8  3.1  5.7   Hemoglobin 11.1 - 15.9 g/dL 9.3  9.4  82.9   Hematocrit 34.0 - 46.6 % 27.0  27.2  31.2   Platelets 150 - 450 x10E3/uL 113  199  175        Latest Ref Rng & Units 03/04/2023    4:29 PM 03/04/2023    5:16 AM 03/03/2023    1:10 PM  CMP  Glucose 70 - 99 mg/dL  562  98   BUN 6 - 20 mg/dL  16  11   Creatinine 1.30 - 1.00 mg/dL  8.65  7.84   Sodium 696 - 145 mmol/L  138  133   Potassium 3.5 - 5.1 mmol/L  4.0  3.6   Chloride 98 - 111 mmol/L  109  100   CO2 22 - 32 mmol/L  23  26   Calcium 8.9 - 10.3 mg/dL  7.8  7.9   Total Protein 6.5 - 8.1 g/dL 7.3     Total Bilirubin 0.3 - 1.2 mg/dL 0.2     Alkaline Phos 38 - 126 U/L 45     AST 15 - 41 U/L 20     ALT 0 - 44 U/L 13       Cd4 is 87 ( 17%) VL 391 K     Assessment/Plan:  57 year old female with HIV unknown CD4 and viral load, apparently followed at [facility] community center and was  on [place] but has not been taking it for many months now and has not followed up with them presents with shortness of breath and cough of 2 months duration worse recently and also complaining of fever for the last 2 months. Patient also has a weight loss of nearly 50 pounds in the past year to 2   Left lower lobe consolidation Even though this likely is bacterial pneumonia need to rule out tuberculosis especially with the symptoms of fever, cough and shortness of breath of 2 months duration.  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

## 2023-03-06 DIAGNOSIS — J432 Centrilobular emphysema: Secondary | ICD-10-CM | POA: Diagnosis not present

## 2023-03-06 DIAGNOSIS — B20 Human immunodeficiency virus [HIV] disease: Secondary | ICD-10-CM | POA: Diagnosis not present

## 2023-03-06 DIAGNOSIS — J189 Pneumonia, unspecified organism: Secondary | ICD-10-CM | POA: Diagnosis not present

## 2023-03-06 DIAGNOSIS — R7989 Other specified abnormal findings of blood chemistry: Secondary | ICD-10-CM | POA: Diagnosis not present

## 2023-03-06 NOTE — Plan of Care (Signed)
  Problem: Education: Goal: Knowledge of General Education information will improve Description: Including pain rating scale, medication(s)/side effects and non-pharmacologic comfort measures Outcome: Progressing   Problem: Health Behavior/Discharge Planning: Goal: Ability to manage health-related needs will improve Outcome: Progressing   Problem: Clinical Measurements: Goal: Respiratory complications will improve Outcome: Progressing Goal: Cardiovascular complication will be avoided Outcome: Progressing   Problem: Activity: Goal: Risk for activity intolerance will decrease Outcome: Progressing   Problem: Nutrition: Goal: Adequate nutrition will be maintained Outcome: Progressing   Problem: Elimination: Goal: Will not experience complications related to bowel motility Outcome: Progressing Goal: Will not experience complications related to urinary retention Outcome: Progressing

## 2023-03-06 NOTE — Progress Notes (Signed)
Progress Note   Patient: Joy Davis GHW:[id number] DOB: 17-Sep-1965 DOA: 03/03/2023     3 DOS: the patient was seen and examined on 03/06/2023   Brief hospital course: Ms. Amberlynn Stradling is a 57 year old female with history of COPD/emphysema, HIV status, not taking HIV medication, who presents to the emergency department for chief concerns of shortness of breath, cough for 2 months, occasional fevers.  Patient is admitted to the hospitalist service with impression of community-acquired pneumonia given she is immunocompromised with HIV status, not compliant with medications.  Assessment and Plan: * Community acquired pneumonia Due to her HIV status TB need to ruled out. AFB x3 pending. Continue ceftriaxone 2 g IV daily and azithromycin for 5-day course Encourage incentive spirometry ID follow up appreciated, continue airborne isolation, QuantiFERON, sputum samples sent. Continue antitussives as needed.   Encourage out of bed to chair, incentive spirometry.  Elevated lactic acid level Patient lactic acid normal status post IV hydration.  Emphysema lung (HCC) With mild exacerbation secondary to left lower lobe pneumonia and right lower lobe posterior mild consolidation, steroids DuoNebs 3 times daily Incentive spirometry, flutter valve, 2 8-10 reps every 2 hours while awake, 45 days is recommended  Tobacco use Continue nicotine patch. Patient will continue on bronchodilator, supplemental oxygen as needed.  HIV (human immunodeficiency virus infection) (HCC) ID recommended Biktarvy therapy. Viral loads, CD4 pending.   Outpatient HIV clinic follow-up suggested.      Subjective: Patient is seen and examined today morning. She feels better today. Her cough improved.  She is eating fair.  Advised out of bed, incentive spirometry.   Physical Exam: Vitals:   03/05/23 1545 03/05/23 2212 03/06/23 0129 03/06/23 0742  BP: 112/73 131/84 133/79 137/73  Pulse:  81 79 73  Resp:  18 20 16    Temp:  98.1 F (36.7 C) 98.7 F (37.1 C) 98.5 F (36.9 C)  TempSrc:      SpO2:  98% 97% 97%  Weight:      Height:       General - Middle aged Caucasian thin built female, no respiratory distress. HEENT - PERRLA, EOMI, atraumatic head, non tender sinuses. Lung - Clear, diffuse Rales, bibasilar rhonchi Heart - S1, S2 heard, no murmurs, rubs, no pedal edema Neuro - Alert, awake and oriented x 3, non focal exam. Skin - Warm and dry. Data Reviewed:     Latest Ref Rng & Units 03/06/2023    6:17 AM 03/04/2023    3:07 PM 03/04/2023    5:16 AM  CBC  WBC 4.0 - 10.5 K/uL 4.3  3.8  3.1   Hemoglobin 12.0 - 15.0 g/dL 78.9  9.3  9.4   Hematocrit 36.0 - 46.0 % 31.5  27.0  27.2   Platelets 150 - 400 K/uL 144  113  199        Latest Ref Rng & Units 03/04/2023    5:16 AM 03/03/2023    1:10 PM  BMP  Glucose 70 - 99 mg/dL 381  98   BUN 6 - 20 mg/dL 16  11   Creatinine 0.17 - 1.00 mg/dL 5.10  2.58   Sodium 527 - 145 mmol/L 138  133   Potassium 3.5 - 5.1 mmol/L 4.0  3.6   Chloride 98 - 111 mmol/L 109  100   CO2 22 - 32 mmol/L 23  26   Calcium 8.9 - 10.3 mg/dL 7.8  7.9    CT Chest W Contrast  Result Date: 03/03/2023 CLINICAL DATA:  Short of breath for 2 months, cough, tobacco abuse, history of emphysema and HIV EXAM: CT CHEST WITH CONTRAST TECHNIQUE: Multidetector CT imaging of the chest was performed during intravenous contrast administration. RADIATION DOSE REDUCTION: This exam was performed according to the departmental dose-optimization program which includes automated exposure control, adjustment of the mA and/or kV according to patient size and/or use of iterative reconstruction technique. CONTRAST:  OMNIPAQUE IOHEXOL 300 MG/ML  SOLN COMPARISON:  03/03/2023 FINDINGS: Cardiovascular: The heart is unremarkable without pericardial effusion. No evidence of thoracic aortic aneurysm or dissection. Atherosclerosis of the aortic arch and descending thoracic aorta. Mediastinum/Nodes: No enlarged  mediastinal, hilar, or axillary lymph nodes. Thyroid gland, trachea, and esophagus demonstrate no significant findings. Lungs/Pleura: Upper lobe predominant emphysema. Dense consolidation within the left lower lobe consistent with infection or aspiration. Follow-up to resolution recommended. Minimal dependent consolidation within the right lower lobe posterior costophrenic angle, favor atelectasis. No effusion or pneumothorax. Central airways are patent. There is no abnormality in the right upper lobe to correspond to the chest x-ray finding, which likely reflects summation artifact. Upper Abdomen: No acute abnormality. Musculoskeletal: No acute or destructive bony abnormalities. Reconstructed images demonstrate no additional findings. IMPRESSION: 1. Dense left lower lobe consolidation consistent with infection or aspiration. Follow-up imaging 3-4 weeks after completion of appropriate medical management is recommended to document resolution and exclude underlying neoplasm. 2. Minimal dependent consolidation right lower lobe posterior costophrenic angle, favor atelectasis. 3. Aortic Atherosclerosis (ICD10-I70.0) and Emphysema (ICD10-J43.9). Electronically Signed   By: Sharlet Salina M.D.   On: 03/03/2023 14:47   DG Chest 2 View  Result Date: 03/03/2023 CLINICAL DATA:  SOB EXAM: CHEST - 2 VIEW COMPARISON:  None Available. FINDINGS: The cardiomediastinal silhouette is normal in contour. No pleural effusion. No pneumothorax. LEFT lower lobe reticulonodular opacity. Faint nodular opacity of the RIGHT upper lateral lung. Visualized abdomen is unremarkable. Mild multilevel degenerative changes of the thoracic spine. IMPRESSION: 1. LEFT lower lobe reticulonodular opacity. Differential consideration include infection, aspiration or atelectasis. Consider further evaluation with dedicated chest CT with contrast versus follow-up PA and lateral chest radiograph in 4 weeks after appropriate treatment. 2. Faint nodular opacity  of the RIGHT upper lateral lung. This could reflect a pulmonary nodule versus summation artifact. Attention on follow-up. Electronically Signed   By: Meda Klinefelter M.D.   On: 03/03/2023 12:59     Family Communication: Patient understands and agree with current plan.  Disposition: Status is: Inpatient Remains inpatient appropriate because: IV antibiotics, rule out TB, HIV related tests and management.  Planned Discharge Destination: Home    Time spent: 42 minutes  Author: Marcelino Duster, MD 03/06/2023 1:39 PM  For on call review [email or web address].

## 2023-03-06 NOTE — Progress Notes (Signed)
Date of Admission:  03/03/2023      ID: Alvada Olano is a 57 y.o. female  Principal Problem:   Community acquired pneumonia Active Problems:   Emphysema lung (HCC)   HIV (human immunodeficiency virus infection) (HCC)   Sinus tachycardia   Tobacco use   Elevated lactic acid level   Weight loss    Subjective: Pt feeling better Cough improving No fever  Medications:   azithromycin  500 mg Oral Daily   bictegravir-emtricitabine-tenofovir AF  1 tablet Oral Daily   enoxaparin (LOVENOX) injection  40 mg Subcutaneous Q24H   sulfamethoxazole-trimethoprim  1 tablet Oral Daily    Objective: Vital signs in last 24 hours: Patient Vitals for the past 24 hrs:  BP Temp Temp src Pulse Resp SpO2  03/06/23 0742 137/73 98.5 F (36.9 C) -- 73 16 97 %  03/06/23 0129 133/79 98.7 F (37.1 C) -- 79 20 97 %  03/05/23 2212 131/84 98.1 F (36.7 C) -- 81 18 98 %  03/05/23 1545 112/73 -- -- -- -- --  03/05/23 1540 (!) 117/102 98.7 F (37.1 C) Oral 77 16 100 %     Lab Results    Latest Ref Rng & Units 03/06/2023    6:17 AM 03/04/2023    3:07 PM 03/04/2023    5:16 AM  CBC  WBC 4.0 - 10.5 K/uL 4.3  3.8  3.1   Hemoglobin 12.0 - 15.0 g/dL 21.3  9.3  9.4   Hematocrit 36.0 - 46.0 % 31.5  27.0  27.2   Platelets 150 - 400 K/uL 144  113  199        Latest Ref Rng & Units 03/04/2023    4:29 PM 03/04/2023    5:16 AM 03/03/2023    1:10 PM  CMP  Glucose 70 - 99 mg/dL  086  98   BUN 6 - 20 mg/dL  16  11   Creatinine 5.78 - 1.00 mg/dL  4.69  6.29   Sodium 528 - 145 mmol/L  138  133   Potassium 3.5 - 5.1 mmol/L  4.0  3.6   Chloride 98 - 111 mmol/L  109  100   CO2 22 - 32 mmol/L  23  26   Calcium 8.9 - 10.3 mg/dL  7.8  7.9   Total Protein 6.5 - 8.1 g/dL 7.3     Total Bilirubin 0.3 - 1.2 mg/dL 0.2     Alkaline Phos 38 - 126 U/L 45     AST 15 - 41 U/L 20     ALT 0 - 44 U/L 13       Cd4 is 87 ( 17%) VL 391 K     Assessment/Plan:  57 year old female with HIV unknown CD4 and viral load,  apparently followed at [facility] community center and was on [place] but has not been taking it for many months now and has not followed up with them presents with shortness of breath and cough of 2 months duration worse recently and also complaining of fever for the last 2 months. Patient also has a weight loss of nearly 50 pounds in the past year to 2   Left lower lobe consolidation Even though this likely is bacterial pneumonia need to rule out tuberculosis especially with the symptoms of fever, cough and shortness of breath of 2 months duration.  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

## 2023-03-07 ENCOUNTER — Other Ambulatory Visit: Payer: Self-pay

## 2023-03-07 DIAGNOSIS — J181 Lobar pneumonia, unspecified organism: Secondary | ICD-10-CM | POA: Diagnosis not present

## 2023-03-07 DIAGNOSIS — B20 Human immunodeficiency virus [HIV] disease: Secondary | ICD-10-CM | POA: Diagnosis not present

## 2023-03-07 DIAGNOSIS — J441 Chronic obstructive pulmonary disease with (acute) exacerbation: Secondary | ICD-10-CM | POA: Diagnosis not present

## 2023-03-07 DIAGNOSIS — R7989 Other specified abnormal findings of blood chemistry: Secondary | ICD-10-CM | POA: Diagnosis not present

## 2023-03-07 DIAGNOSIS — J440 Chronic obstructive pulmonary disease with acute lower respiratory infection: Secondary | ICD-10-CM | POA: Diagnosis not present

## 2023-03-07 DIAGNOSIS — J432 Centrilobular emphysema: Secondary | ICD-10-CM | POA: Diagnosis not present

## 2023-03-07 DIAGNOSIS — J189 Pneumonia, unspecified organism: Secondary | ICD-10-CM | POA: Diagnosis not present

## 2023-03-07 MED ORDER — BIKTARVY 50-200-25 MG PO TABS
1.0000 | ORAL_TABLET | Freq: Every day | ORAL | 1 refills | Status: AC
  Filled 2023-03-07: qty 30, 30d supply, fill #0

## 2023-03-07 MED ORDER — GUAIFENESIN-DM 100-10 MG/5ML PO SYRP
5.0000 mL | ORAL_SOLUTION | ORAL | Status: DC | PRN
  Administered 2023-03-07: 5 mL via ORAL
  Filled 2023-03-07: qty 10

## 2023-03-07 MED ORDER — SULFAMETHOXAZOLE-TRIMETHOPRIM 400-80 MG PO TABS
1.0000 | ORAL_TABLET | Freq: Every day | ORAL | 1 refills | Status: DC
  Filled 2023-03-07: qty 30, 30d supply, fill #0

## 2023-03-07 NOTE — Plan of Care (Signed)
  Problem: Education: Goal: Knowledge of General Education information will improve Description: Including pain rating scale, medication(s)/side effects and non-pharmacologic comfort measures Outcome: Progressing   Problem: Health Behavior/Discharge Planning: Goal: Ability to manage health-related needs will improve Outcome: Progressing   Problem: Clinical Measurements: Goal: Respiratory complications will improve Outcome: Progressing   Problem: Nutrition: Goal: Adequate nutrition will be maintained Outcome: Progressing   Problem: Elimination: Goal: Will not experience complications related to bowel motility Outcome: Progressing Goal: Will not experience complications related to urinary retention Outcome: Progressing   Problem: Pain Managment: Goal: General experience of comfort will improve Outcome: Progressing

## 2023-03-07 NOTE — Progress Notes (Signed)
Progress Note   Patient: Joy Davis KGM:[id number] DOB: Jun 25, 1966 DOA: 03/03/2023     4 DOS: the patient was seen and examined on 03/07/2023   Brief hospital course: Ms. Milada Desforges is a 57 year old female with history of COPD/emphysema, HIV status, not taking HIV medication, who presents to the emergency department for chief concerns of shortness of breath, cough for 2 months, occasional fevers.  Patient is admitted to the hospitalist service with impression of community-acquired pneumonia given she is immunocompromised with HIV status, not compliant with medications.  Assessment and Plan: * Community acquired pneumonia Due to her HIV status TB need to ruled out. AFB x3 pending. Continue ceftriaxone 2 g IV daily and azithromycin for 5-day course Encourage incentive spirometry ID follow up appreciated, continue airborne isolation, 2 AFB negative so far. Quantiferon pending.  Continue antitussives as needed.   Encourage out of bed to chair, incentive spirometry.  Elevated lactic acid level Patient lactic acid normal status post IV hydration.  Emphysema lung (HCC) With mild exacerbation secondary to left lower lobe pneumonia and right lower lobe posterior mild consolidation, steroids DuoNebs 3 times daily Incentive spirometry, flutter valve, 2 8-10 reps every 2 hours while awake, 45 days is recommended  Tobacco use Continue nicotine patch. Patient will continue on bronchodilator, supplemental oxygen as needed.  HIV (human immunodeficiency virus infection) (HCC) Started on Biktarvy therapy. Viral load 391k, CD4 87  Outpatient HIV clinic follow-up suggested. Pharmacy arranging for her outpatient meds.     Subjective: Patient is seen and examined today morning. She has cough but improved.  Eating fair.  Encouraged to get out of bed.    Physical Exam: Vitals:   03/06/23 2149 03/06/23 2310 03/07/23 0726 03/07/23 1553  BP: 119/76 124/77 130/78 103/70  Pulse: 75 78 66 77   Resp: 16 20 16 17   Temp: 98 F (36.7 C) 98.5 F (36.9 C) 97.9 F (36.6 C) 97.8 F (36.6 C)  TempSrc:    Oral  SpO2: 98% 99% 98% 98%  Weight:      Height:       General - Middle aged Caucasian thin built female, no respiratory distress. HEENT - PERRLA, EOMI, atraumatic head, non tender sinuses. Lung - Clear, diffuse Rales, bibasilar rhonchi Heart - S1, S2 heard, no murmurs, rubs, no pedal edema Neuro - Alert, awake and oriented x 3, non focal exam. Skin - Warm and dry. Data Reviewed:     Latest Ref Rng & Units 03/06/2023    6:17 AM 03/04/2023    3:07 PM 03/04/2023    5:16 AM  CBC  WBC 4.0 - 10.5 K/uL 4.3  3.8  3.1   Hemoglobin 12.0 - 15.0 g/dL 64.4  9.3  9.4   Hematocrit 36.0 - 46.0 % 31.5  27.0  27.2   Platelets 150 - 400 K/uL 144  113  199        Latest Ref Rng & Units 03/04/2023    5:16 AM 03/03/2023    1:10 PM  BMP  Glucose 70 - 99 mg/dL 034  98   BUN 6 - 20 mg/dL 16  11   Creatinine 7.42 - 1.00 mg/dL 5.95  6.38   Sodium 756 - 145 mmol/L 138  133   Potassium 3.5 - 5.1 mmol/L 4.0  3.6   Chloride 98 - 111 mmol/L 109  100   CO2 22 - 32 mmol/L 23  26   Calcium 8.9 - 10.3 mg/dL 7.8  7.9    CT Chest  W Contrast  Result Date: 03/03/2023 CLINICAL DATA:  Short of breath for 2 months, cough, tobacco abuse, history of emphysema and HIV EXAM: CT CHEST WITH CONTRAST TECHNIQUE: Multidetector CT imaging of the chest was performed during intravenous contrast administration. RADIATION DOSE REDUCTION: This exam was performed according to the departmental dose-optimization program which includes automated exposure control, adjustment of the mA and/or kV according to patient size and/or use of iterative reconstruction technique. CONTRAST:  OMNIPAQUE IOHEXOL 300 MG/ML  SOLN COMPARISON:  03/03/2023 FINDINGS: Cardiovascular: The heart is unremarkable without pericardial effusion. No evidence of thoracic aortic aneurysm or dissection. Atherosclerosis of the aortic arch and descending  thoracic aorta. Mediastinum/Nodes: No enlarged mediastinal, hilar, or axillary lymph nodes. Thyroid gland, trachea, and esophagus demonstrate no significant findings. Lungs/Pleura: Upper lobe predominant emphysema. Dense consolidation within the left lower lobe consistent with infection or aspiration. Follow-up to resolution recommended. Minimal dependent consolidation within the right lower lobe posterior costophrenic angle, favor atelectasis. No effusion or pneumothorax. Central airways are patent. There is no abnormality in the right upper lobe to correspond to the chest x-ray finding, which likely reflects summation artifact. Upper Abdomen: No acute abnormality. Musculoskeletal: No acute or destructive bony abnormalities. Reconstructed images demonstrate no additional findings. IMPRESSION: 1. Dense left lower lobe consolidation consistent with infection or aspiration. Follow-up imaging 3-4 weeks after completion of appropriate medical management is recommended to document resolution and exclude underlying neoplasm. 2. Minimal dependent consolidation right lower lobe posterior costophrenic angle, favor atelectasis. 3. Aortic Atherosclerosis (ICD10-I70.0) and Emphysema (ICD10-J43.9). Electronically Signed   By: Sharlet Salina M.D.   On: 03/03/2023 14:47   DG Chest 2 View  Result Date: 03/03/2023 CLINICAL DATA:  SOB EXAM: CHEST - 2 VIEW COMPARISON:  None Available. FINDINGS: The cardiomediastinal silhouette is normal in contour. No pleural effusion. No pneumothorax. LEFT lower lobe reticulonodular opacity. Faint nodular opacity of the RIGHT upper lateral lung. Visualized abdomen is unremarkable. Mild multilevel degenerative changes of the thoracic spine. IMPRESSION: 1. LEFT lower lobe reticulonodular opacity. Differential consideration include infection, aspiration or atelectasis. Consider further evaluation with dedicated chest CT with contrast versus follow-up PA and lateral chest radiograph in 4 weeks after  appropriate treatment. 2. Faint nodular opacity of the RIGHT upper lateral lung. This could reflect a pulmonary nodule versus summation artifact. Attention on follow-up. Electronically Signed   By: Meda Klinefelter M.D.   On: 03/03/2023 12:59     Family Communication: Patient understands and agree with current plan.  Disposition: Status is: Inpatient Remains inpatient appropriate because: IV antibiotics, rule out TB, HIV related tests and management.  Planned Discharge Destination: Home    Time spent: 42 minutes  Author: Marcelino Duster, MD 03/07/2023 5:01 PM  For on call review [email or web address].

## 2023-03-07 NOTE — Progress Notes (Signed)
Pt refuses IV. Pt states she is leaving tomorrow.

## 2023-03-07 NOTE — Progress Notes (Signed)
Date of Admission:  03/03/2023      ID: Joy Davis is a 57 y.o. female  Principal Problem:   Community acquired pneumonia Active Problems:   Emphysema lung (HCC)   HIV (human immunodeficiency virus infection) (HCC)   Sinus tachycardia   Tobacco use   Elevated lactic acid level   Weight loss   AIDS (acquired immune deficiency syndrome) (HCC)    Subjective: She is feeling better Cough is better   Medications:   bictegravir-emtricitabine-tenofovir AF  1 tablet Oral Daily   enoxaparin (LOVENOX) injection  40 mg Subcutaneous Q24H   sulfamethoxazole-trimethoprim  1 tablet Oral Daily    Objective: Vital signs in last 24 hours: Patient Vitals for the past 24 hrs:  BP Temp Temp src Pulse Resp SpO2  03/07/23 1553 103/70 97.8 F (36.6 C) Oral 77 17 98 %  03/07/23 0726 130/78 97.9 F (36.6 C) -- 66 16 98 %     Lab Results    Latest Ref Rng & Units 03/06/2023    6:17 AM 03/04/2023    3:07 PM 03/04/2023    5:16 AM  CBC  WBC 4.0 - 10.5 K/uL 4.3  3.8  3.1   Hemoglobin 12.0 - 15.0 g/dL 86.5  9.3  9.4   Hematocrit 36.0 - 46.0 % 31.5  27.0  27.2   Platelets 150 - 400 K/uL 144  113  199        Latest Ref Rng & Units 03/04/2023    4:29 PM 03/04/2023    5:16 AM 03/03/2023    1:10 PM  CMP  Glucose 70 - 99 mg/dL  784  98   BUN 6 - 20 mg/dL  16  11   Creatinine 6.96 - 1.00 mg/dL  2.95  2.84   Sodium 132 - 145 mmol/L  138  133   Potassium 3.5 - 5.1 mmol/L  4.0  3.6   Chloride 98 - 111 mmol/L  109  100   CO2 22 - 32 mmol/L  23  26   Calcium 8.9 - 10.3 mg/dL  7.8  7.9   Total Protein 6.5 - 8.1 g/dL 7.3     Total Bilirubin 0.3 - 1.2 mg/dL 0.2     Alkaline Phos 38 - 126 U/L 45     AST 15 - 41 U/L 20     ALT 0 - 44 U/L 13       Cd4 is 87 ( 17%) VL 391 K     Assessment/Plan:  57 year old female with HIV unknown CD4 and viral load, apparently followed at [facility] community center and was on [place] but has not been taking it for many months now and has not followed up  with them presents with shortness of breath and cough of 2 months duration worse recently and also complaining of fever for the last 2 months. Patient also has a weight loss of nearly 50 pounds in the past year to 2   Left lower lobe consolidation Even though this likely is bacterial pneumonia need to rule out tuberculosis especially with the symptoms of fever, cough and shortness of breath of 2 months duration.  3 sputum's for AFB sent.   AFB smear negative in 2 specimens  QuantiFERON gold sent Patient can be discharged tomorrow  On ceftriaxone and azithromycin.  Has completed 5 days Proca=l N, LDH N   AIDS Vl 430K and cd4 is 52 Restart Biktarvy Discussed with case Production designer, theatre/television/film at [employer].  We will give  her a month supply of Biktarvy and she will follow-up with them.  Also put her on Bactrim for PCP prophylaxis. They did call her and spoke to her through her room phone.  Patient has many social issues including not having a phone   History of substance use -cocaine  clean for a few years     Hep C antibody positive but negative RNA   Current smoker   COPD   Anemia   Leukopenia   Hypoalbuminemia   Weight loss   Discussed the management with the patient

## 2023-03-08 ENCOUNTER — Other Ambulatory Visit: Payer: Self-pay

## 2023-03-08 DIAGNOSIS — R634 Abnormal weight loss: Secondary | ICD-10-CM | POA: Diagnosis not present

## 2023-03-08 DIAGNOSIS — B20 Human immunodeficiency virus [HIV] disease: Secondary | ICD-10-CM | POA: Diagnosis not present

## 2023-03-08 DIAGNOSIS — J441 Chronic obstructive pulmonary disease with (acute) exacerbation: Secondary | ICD-10-CM | POA: Diagnosis not present

## 2023-03-08 DIAGNOSIS — Z599 Problem related to housing and economic circumstances, unspecified: Secondary | ICD-10-CM

## 2023-03-08 DIAGNOSIS — Z720 Tobacco use: Secondary | ICD-10-CM | POA: Diagnosis not present

## 2023-03-08 MED ORDER — DEXTROMETHORPHAN-GUAIFENESIN 20-200 MG/20ML PO LIQD
10.0000 mL | ORAL | 0 refills | Status: DC | PRN
  Filled 2023-03-08: qty 236, 4d supply, fill #0

## 2023-03-08 MED ORDER — NICOTINE 14 MG/24HR TD PT24
14.0000 mg | MEDICATED_PATCH | Freq: Every day | TRANSDERMAL | 0 refills | Status: DC | PRN
  Filled 2023-03-08: qty 28, 28d supply, fill #0

## 2023-03-08 NOTE — Progress Notes (Signed)
Discharge instructions reviewed with patient. She stated she needs a taxi voucher. Notified Faye,TOC. She will get her a voucher. Pt has her medication already from [facility].

## 2023-03-08 NOTE — Plan of Care (Signed)
  Problem: Clinical Measurements: Goal: Diagnostic test results will improve Outcome: Progressing Goal: Respiratory complications will improve Outcome: Progressing   Problem: Safety: Goal: Ability to remain free from injury will improve Outcome: Progressing   

## 2023-03-08 NOTE — Discharge Summary (Signed)
Physician Discharge Summary   Patient: Joy Davis MRN: [id number] DOB: [birth year]  Admit date:     03/03/2023  Discharge date: 03/08/23  Discharge Physician: Marcelino Duster   PCP: [facility]   Recommendations at discharge:    PCP in 1 week. ID clinic follow up.  Discharge Diagnoses: Principal Problem:   Community acquired pneumonia Active Problems:   Emphysema lung (HCC)   HIV (human immunodeficiency virus infection) (HCC)   Sinus tachycardia   Tobacco use   Elevated lactic acid level   Weight loss   AIDS (acquired immune deficiency syndrome) (HCC)   COPD exacerbation (HCC)  Resolved Problems:   Financial difficulty  Hospital Course: Joy Davis is a 57 year old female with history of COPD/emphysema, HIV status, not taking HIV medication, who presents to the emergency department for chief concerns of shortness of breath for 2 months.  Vitals in the emergency department showed temperature 98.8, respiration rate of 28, blood pressure 115/74, SpO2 94% on room air, heart rate 103.  Serum sodium is 133, potassium 2.6, chloride 100, bicarb 26, BUN of 10, serum creatinine of 0.76, EGFR greater than 60, nonfasting blood glucose 98, WBC 5.3, hemoglobin 10.4, platelets of 175.  High sensitive troponin is 4.  Chest x-ray 2 views: Left lower lobe reticulonodular opacity.  Differentials consideration include infection, aspiration, atelectasis.  CT chest with contrast: read as dense left lower lobe consolidation consistent with infection or aspiration.  Aortic atherosclerosis.  ED treatment: Solu-Medrol 125 mg IV one-time dose, DuoNebs 3 treatments, azithromycin 500 mg IV, ceftriaxone 1 g IV.  Patient is admitted to the hospitalist service for further management evaluation of community-acquired pneumonia given her immunocompromised, HIV status not compliant with her medications.  Patient is continued on Rocephin therapy and azithromycin for 5-day course.   ID evaluation called who recommended airborne isolation rule out TB.  3 AFB send QuantiFERON sent.  Patient did have worse cough, continued on antitussive medications.  Viral load 391 K, CD4 87, ID physician recommended Biktarvy therapy and advised to be compliant.  QuantiFERON gold came back negative.  She is hemodynamically stable to be discharged home.  Pharmacy did arrange for home medications Biktarvy, Bactrim.  Patient is advised to be compliant with medications.  Patient understands and agrees with the discharge plan.  Assessment and Plan: Community acquired pneumonia Due to her HIV status TB need to ruled out. AFB x3 pending. Continue ceftriaxone 2 g IV daily and azithromycin for 5-day course Encourage incentive spirometry ID follow up appreciated, continue airborne isolation, 2 AFB negative so far. Quantiferon pending.  Continue antitussives as needed.   Encourage out of bed to chair, incentive spirometry.   Elevated lactic acid level Patient lactic acid normal status post IV hydration.   Emphysema lung (HCC) With mild exacerbation secondary to left lower lobe pneumonia and right lower lobe posterior mild consolidation, steroids Advised bronchodilators, mucinex. Smoking cessation advised.   Tobacco use Continue nicotine patch. Patient will continue on bronchodilator PRN.   HIV (human immunodeficiency virus infection) (HCC) Started on Biktarvy therapy. Viral load 391k, CD4 87  Outpatient HIV clinic follow-up suggested. Pharmacy arranging for her outpatient meds.         Consultants: ID Procedures performed: none  Disposition: Home Diet recommendation:  Discharge Diet Orders (From admission, onward)     Start     Ordered   03/08/23 0000  Diet - low sodium heart healthy        03/08/23 1317  Cardiac diet DISCHARGE MEDICATION: Allergies as of 03/08/2023   No Known Allergies      Medication List     TAKE these medications    Biktarvy 50-200-25  MG Tabs tablet Generic drug: bictegravir-emtricitabine-tenofovir AF Take 1 tablet by mouth daily. Start taking on: March 09, 2023   guaiFENesin-dextromethorphan 100-10 MG/5ML syrup Commonly known as: ROBITUSSIN DM Take 5 mLs by mouth every 4 (four) hours as needed for cough (chest congestion).   nicotine 14 mg/24hr patch Commonly known as: NICODERM CQ - dosed in mg/24 hours Place 1 patch (14 mg total) onto the skin daily as needed (nicotine craving).   sulfamethoxazole-trimethoprim 400-80 MG tablet Commonly known as: BACTRIM Take 1 tablet by mouth daily. Start taking on: March 09, 2023        Discharge Exam: Ceasar Mons Weights   03/03/23 1235  Weight: 45.4 kg   General - Middle aged Caucasian thin built female, no respiratory distress. HEENT - PERRLA, EOMI, atraumatic head, non tender sinuses. Lung - Clear, diffuse Rales, bibasilar rhonchi Heart - S1, S2 heard, no murmurs, rubs, no pedal edema Neuro - Alert, awake and oriented x 3, non focal exam. Skin - Warm and dry.  Condition at discharge: stable  The results of significant diagnostics from this hospitalization (including imaging, microbiology, ancillary and laboratory) are listed below for reference.   Imaging Studies: CT Chest W Contrast  Result Date: 03/03/2023 CLINICAL DATA:  Short of breath for 2 months, cough, tobacco abuse, history of emphysema and HIV EXAM: CT CHEST WITH CONTRAST TECHNIQUE: Multidetector CT imaging of the chest was performed during intravenous contrast administration. RADIATION DOSE REDUCTION: This exam was performed according to the departmental dose-optimization program which includes automated exposure control, adjustment of the mA and/or kV according to patient size and/or use of iterative reconstruction technique. CONTRAST:  OMNIPAQUE IOHEXOL 300 MG/ML  SOLN COMPARISON:  03/03/2023 FINDINGS: Cardiovascular: The heart is unremarkable without pericardial effusion. No evidence of thoracic aortic  aneurysm or dissection. Atherosclerosis of the aortic arch and descending thoracic aorta. Mediastinum/Nodes: No enlarged mediastinal, hilar, or axillary lymph nodes. Thyroid gland, trachea, and esophagus demonstrate no significant findings. Lungs/Pleura: Upper lobe predominant emphysema. Dense consolidation within the left lower lobe consistent with infection or aspiration. Follow-up to resolution recommended. Minimal dependent consolidation within the right lower lobe posterior costophrenic angle, favor atelectasis. No effusion or pneumothorax. Central airways are patent. There is no abnormality in the right upper lobe to correspond to the chest x-ray finding, which likely reflects summation artifact. Upper Abdomen: No acute abnormality. Musculoskeletal: No acute or destructive bony abnormalities. Reconstructed images demonstrate no additional findings. IMPRESSION: 1. Dense left lower lobe consolidation consistent with infection or aspiration. Follow-up imaging 3-4 weeks after completion of appropriate medical management is recommended to document resolution and exclude underlying neoplasm. 2. Minimal dependent consolidation right lower lobe posterior costophrenic angle, favor atelectasis. 3. Aortic Atherosclerosis (ICD10-I70.0) and Emphysema (ICD10-J43.9). Electronically Signed   By: Sharlet Salina M.D.   On: 03/03/2023 14:47   DG Chest 2 View  Result Date: 03/03/2023 CLINICAL DATA:  SOB EXAM: CHEST - 2 VIEW COMPARISON:  None Available. FINDINGS: The cardiomediastinal silhouette is normal in contour. No pleural effusion. No pneumothorax. LEFT lower lobe reticulonodular opacity. Faint nodular opacity of the RIGHT upper lateral lung. Visualized abdomen is unremarkable. Mild multilevel degenerative changes of the thoracic spine. IMPRESSION: 1. LEFT lower lobe reticulonodular opacity. Differential consideration include infection, aspiration or atelectasis. Consider further evaluation with dedicated chest CT with  contrast versus  follow-up PA and lateral chest radiograph in 4 weeks after appropriate treatment. 2. Faint nodular opacity of the RIGHT upper lateral lung. This could reflect a pulmonary nodule versus summation artifact. Attention on follow-up. Electronically Signed   By: Meda Klinefelter M.D.   On: 03/03/2023 12:59    Microbiology: Results for orders placed or performed during the hospital encounter of 03/03/23  SARS Coronavirus 2 by RT PCR (hospital order, performed in [facility] hospital lab) *cepheid single result test* Anterior Nasal Swab     Status: None   Collection Time: 03/03/23  6:22 PM   Specimen: Anterior Nasal Swab  Result Value Ref Range Status   SARS Coronavirus 2 by RT PCR NEGATIVE NEGATIVE Final    Comment: (NOTE) SARS-CoV-2 target nucleic acids are NOT DETECTED.  The SARS-CoV-2 RNA is generally detectable in upper and lower respiratory specimens during the acute phase of infection. The lowest concentration of SARS-CoV-2 viral copies this assay can detect is 250 copies / mL. A negative result does not preclude SARS-CoV-2 infection and should not be used as the sole basis for treatment or other patient management decisions.  A negative result may occur with improper specimen collection / handling, submission of specimen other than nasopharyngeal swab, presence of viral mutation(s) within the areas targeted by this assay, and inadequate number of viral copies (<250 copies / mL). A negative result must be combined with clinical observations, patient history, and epidemiological information.  Fact Sheet for Patients:   RoadLapTop.co.za  Fact Sheet for Healthcare Providers: http://kim-miller.com/  This test is not yet approved or  cleared by the [place] FDA and has been authorized for detection and/or diagnosis of SARS-CoV-2 by FDA under an Emergency Use Authorization (EUA).  This EUA will remain in effect (meaning this  test can be used) for the duration of the COVID-19 declaration under Section 564(b)(1) of the Act, 21 U.S.C. section 360bbb-3(b)(1), unless the authorization is terminated or revoked sooner.  Performed at [facility], [address] [place], [place] [address]   Respiratory (~20 pathogens) panel by PCR     Status: None   Collection Time: 03/03/23  6:22 PM   Specimen: Nasopharyngeal Swab; Respiratory  Result Value Ref Range Status   Adenovirus NOT DETECTED NOT DETECTED Final   Coronavirus 229E NOT DETECTED NOT DETECTED Final    Comment: (NOTE) The Coronavirus on the Respiratory Panel, DOES NOT test for the novel  Coronavirus (2019 nCoV)    Coronavirus HKU1 NOT DETECTED NOT DETECTED Final   Coronavirus NL63 NOT DETECTED NOT DETECTED Final   Coronavirus OC43 NOT DETECTED NOT DETECTED Final   Metapneumovirus NOT DETECTED NOT DETECTED Final   Rhinovirus / Enterovirus NOT DETECTED NOT DETECTED Final   Influenza A NOT DETECTED NOT DETECTED Final   Influenza B NOT DETECTED NOT DETECTED Final   Parainfluenza Virus 1 NOT DETECTED NOT DETECTED Final   Parainfluenza Virus 2 NOT DETECTED NOT DETECTED Final   Parainfluenza Virus 3 NOT DETECTED NOT DETECTED Final   Parainfluenza Virus 4 NOT DETECTED NOT DETECTED Final   Respiratory Syncytial Virus NOT DETECTED NOT DETECTED Final   Bordetella pertussis NOT DETECTED NOT DETECTED Final   Bordetella Parapertussis NOT DETECTED NOT DETECTED Final   Chlamydophila pneumoniae NOT DETECTED NOT DETECTED Final   Mycoplasma pneumoniae NOT DETECTED NOT DETECTED Final    Comment: Performed at [facility], [address] [place], [place] [address]  Culture, blood (Routine X 2) w Reflex to ID Panel  Status: None (Preliminary result)   Collection Time: 03/04/23  4:22 PM   Specimen: BLOOD  Result Value Ref Range Status   Specimen Description BLOOD BLOOD RIGHT ARM  Final   Special Requests   Final    BOTTLES DRAWN AEROBIC AND ANAEROBIC  Blood Culture adequate volume   Culture   Final    NO GROWTH 4 DAYS Performed at [facility], [address] [place], [place] [address]    Report Status PENDING  Incomplete  Culture, blood (Routine X 2) w Reflex to ID Panel     Status: None (Preliminary result)   Collection Time: 03/04/23  4:29 PM   Specimen: BLOOD  Result Value Ref Range Status   Specimen Description BLOOD BLOOD LEFT HAND  Final   Special Requests   Final    BOTTLES DRAWN AEROBIC AND ANAEROBIC Blood Culture adequate volume   Culture   Final    NO GROWTH 4 DAYS Performed at [facility], [address], [place], [place] [address]    Report Status PENDING  Incomplete  Acid Fast Smear (AFB)     Status: None   Collection Time: 03/04/23  6:48 PM   Specimen: Sputum  Result Value Ref Range Status   AFB Specimen Processing Concentration  Final   Acid Fast Smear Negative  Final    Comment: (NOTE) Performed At: [facility] [address] [place], [place] [id number] Jolene Schimke MD NU:[id number]    Source (AFB) EXPECTORATED SPUTUM  Final    Comment: Performed at [facility] [address] [place], [place] [address]  Acid Fast Smear (AFB)     Status: None   Collection Time: 03/05/23  7:30 AM   Specimen: Sputum  Result Value Ref Range Status   AFB Specimen Processing Concentration  Final   Acid Fast Smear Negative  Final    Comment: (NOTE) Performed At: [facility] [address] [place], [place] [id number] Jolene Schimke MD EP:[id number]    Source (AFB) SPUTUM  Final    Comment: Performed at [facility], [address] [place], [place] [address]    Labs: CBC: Recent Labs  Lab 03/03/23 1310 03/04/23 0516 03/04/23 1507 03/06/23 0617  WBC 5.7 3.1* 3.8 4.3  NEUTROABS  --   --  3.1  --   HGB 10.4* 9.4* 9.3* 10.6*  HCT 31.2* 27.2* 27.0* 31.5*  MCV 90.4 89.5 88 89.2  PLT 175 199 113* 144*   Basic Metabolic Panel: Recent Labs  Lab  03/03/23 1310 03/04/23 0516 03/08/23 0637  NA 133* 138 136  K 3.6 4.0 4.3  CL 100 109 102  CO2 26 23 29   GLUCOSE 98 145* 95  BUN 11 16 16   CREATININE 0.76 0.59 0.85  CALCIUM 7.9* 7.8* 8.5*   Liver Function Tests: Recent Labs  Lab 03/04/23 1629  AST 20  ALT 13  ALKPHOS 45  BILITOT 0.2*  PROT 7.3  ALBUMIN 2.6*   CBG: No results for input(s): "GLUCAP" in the last 168 hours.  Discharge time spent: greater than 30 minutes.  Signed: Marcelino Duster, MD [facility] 03/08/2023

## 2023-03-18 NOTE — Progress Notes (Signed)
Bacterial pneumonia (no organism identified)

## 2023-03-18 NOTE — Progress Notes (Signed)
HIV disease/AIDS cd4 87

## 2023-05-03 ENCOUNTER — Other Ambulatory Visit: Payer: Self-pay | Admitting: Nurse Practitioner

## 2023-05-03 DIAGNOSIS — Z8709 Personal history of other diseases of the respiratory system: Secondary | ICD-10-CM

## 2023-05-03 DIAGNOSIS — F17200 Nicotine dependence, unspecified, uncomplicated: Secondary | ICD-10-CM

## 2023-05-03 DIAGNOSIS — B20 Human immunodeficiency virus [HIV] disease: Secondary | ICD-10-CM

## 2023-05-17 ENCOUNTER — Other Ambulatory Visit: Payer: Self-pay | Admitting: Nurse Practitioner

## 2023-05-17 DIAGNOSIS — Z1231 Encounter for screening mammogram for malignant neoplasm of breast: Secondary | ICD-10-CM

## 2023-05-29 ENCOUNTER — Encounter: Payer: Self-pay | Admitting: *Deleted

## 2023-06-13 ENCOUNTER — Telehealth: Payer: Self-pay

## 2023-06-13 NOTE — Telephone Encounter (Signed)
Pt requesting call back to schedule colonoscopy.

## 2023-06-17 NOTE — Telephone Encounter (Signed)
Tried to call patient but could not leave.

## 2023-06-19 NOTE — Telephone Encounter (Signed)
Called patient this morning, I was able to leave message on mobile number

## 2023-06-26 ENCOUNTER — Other Ambulatory Visit: Payer: Self-pay | Admitting: Emergency Medicine

## 2023-06-26 DIAGNOSIS — Z122 Encounter for screening for malignant neoplasm of respiratory organs: Secondary | ICD-10-CM

## 2023-06-26 DIAGNOSIS — Z87891 Personal history of nicotine dependence: Secondary | ICD-10-CM

## 2023-06-26 DIAGNOSIS — F17210 Nicotine dependence, cigarettes, uncomplicated: Secondary | ICD-10-CM

## 2023-06-28 ENCOUNTER — Encounter: Payer: Self-pay | Admitting: Physician Assistant

## 2023-06-28 ENCOUNTER — Ambulatory Visit: Payer: Medicaid Other | Admitting: Physician Assistant

## 2023-06-28 DIAGNOSIS — F17210 Nicotine dependence, cigarettes, uncomplicated: Secondary | ICD-10-CM | POA: Diagnosis not present

## 2023-06-28 NOTE — Patient Instructions (Signed)

## 2023-06-28 NOTE — Progress Notes (Signed)
Virtual Visit via Telephone Note  I connected with Sandra West on 06/28/23 at  1000 by telephone and verified that I am speaking with the correct person using two identifiers.  Location: Patient: home Provider: working virtually from home   I discussed the limitations, risks, security and privacy concerns of performing an evaluation and management service by telephone and the availability of in person appointments. I also discussed with the patient that there may be a patient responsible charge related to this service. The patient expressed understanding and agreed to proceed.     Shared Decision Making Visit Lung Cancer Screening Program [phone])   Eligibility: Age 57 Pack Years Smoking History Calculation 54 (# packs/per year x # years smoked) Recent History of coughing up blood  No Unexplained weight loss? No ( >Than 15 pounds within the last 6 months ) Prior History Lung / other cancer No (Diagnosis within the last 5 years already requiring surveillance chest CT Scans). Smoking Status Current Smoker  Visit Components: Discussion included one or more decision making aids. Yes Discussion included risk/benefits of screening. Yes Discussion included potential follow up diagnostic testing for abnormal scans. Yes Discussion included meaning and risk of over diagnosis. Yes Discussion included meaning and risk of False Positives. Yes Discussion included meaning of total radiation exposure. Yes  Counseling Included: Importance of adherence to annual lung cancer LDCT screening. Yes Impact of comorbidities on ability to participate in the program. Yes Ability and willingness to under diagnostic treatment: Yes  Smoking Cessation Counseling: Current Smokers:  Discussed importance of smoking cessation. Yes Information about tobacco cessation classes and interventions provided to patient. Yes Symptomatic Patient. No Diagnosis Code: Tobacco Use Z72.0 Asymptomatic Patient  Yes  Counseling (Intermediate counseling: > three minutes counseling) U0454 Information about tobacco cessation classes and interventions provided to patient. Yes Written Order for Lung Cancer Screening with LDCT placed in Epic. Yes (CT Chest Lung Cancer Screening Low Dose W/O CM) [id number] Z12.2-Screening of respiratory organs Z87.891-Personal history of nicotine dependence   I have spent 25 minutes of face to face/ virtual visit  time with the patient discussing the risks and benefits of lung cancer screening. We discussed the above noted topics. We paused at intervals to allow for questions to be asked and answered to ensure understanding.We discussed that the single most powerful action that anyone can take to decrease their risk of developing lung cancer is to quit smoking.  We discussed options for tools to aid in quitting smoking including nicotine replacement therapy, non-nicotine medications, support groups, Quit Smart classes, and behavior modification. We discussed that often times setting smaller, more achievable goals, such as eliminating 1 cigarette a day for a week and then 2 cigarettes a day for a week can be helpful in slowly decreasing the number of cigarettes smoked. I provided  them  with smoking cessation  information  with contact information for community resources, classes, free nicotine replacement therapy, and access to mobile apps, text messaging, and on-line smoking cessation help. I have also provided  them  the office contact information in the event they have any questions. We discussed the time and location of the scan, and that either Abigail Miyamoto RN, Karlton Lemon, RN  or I will call / send a letter with the results within 24-72 hours of receiving them. The patient verbalized understanding of all of  the above and had no further questions upon leaving the office. They have my contact information in the event they have any further questions.  I spent 3 minutes counseling  on smoking cessation and the health risks of continued tobacco abuse.  I explained to the patient that there has been a high incidence of coronary artery disease noted on these exams. I explained that this is a non-gated exam therefore degree or severity cannot be determined. This patient is not on statin therapy. I have asked the patient to follow-up with their PCP regarding any incidental finding of coronary artery disease and management with diet or medication as their PCP  feels is clinically indicated. The patient verbalized understanding of the above and had no further questions upon completion of the visit.    Darcella Gasman Sindy Mccune, PA-C

## 2023-07-03 ENCOUNTER — Other Ambulatory Visit: Payer: Self-pay

## 2023-07-03 ENCOUNTER — Ambulatory Visit
Admission: RE | Admit: 2023-07-03 | Discharge: 2023-07-03 | Disposition: A | Discharge status: Home / Self Care | Payer: Medicaid Other | Source: Ambulatory Visit | Attending: Acute Care | Admitting: Acute Care

## 2023-07-03 ENCOUNTER — Telehealth: Payer: Self-pay

## 2023-07-03 DIAGNOSIS — Z1211 Encounter for screening for malignant neoplasm of colon: Secondary | ICD-10-CM

## 2023-07-03 DIAGNOSIS — Z87891 Personal history of nicotine dependence: Secondary | ICD-10-CM | POA: Insufficient documentation

## 2023-07-03 DIAGNOSIS — Z122 Encounter for screening for malignant neoplasm of respiratory organs: Secondary | ICD-10-CM | POA: Diagnosis present

## 2023-07-03 DIAGNOSIS — F17210 Nicotine dependence, cigarettes, uncomplicated: Secondary | ICD-10-CM | POA: Insufficient documentation

## 2023-07-03 MED ORDER — GOLYTELY 236 G PO SOLR: 4000.0000 mL | Freq: Once | ORAL | 0 refills | Status: AC

## 2023-07-03 NOTE — Telephone Encounter (Signed)
Gastroenterology Pre-Procedure Review  Request Date: 07/25/23 Requesting Physician: Dr. Allegra Lai  PATIENT REVIEW QUESTIONS: The patient responded to the following health history questions as indicated:    1. Are you having any GI issues? no 2. Do you have a personal history of Polyps? no 3. Do you have a family history of Colon Cancer or Polyps? yes (cousins colon polyps) 4. Diabetes Mellitus? no 5. Joint replacements in the past 12 months?no 6. Major health problems in the past 3 months?yes (ER Admission for COPD 03/03/23 pulmonary clearance sent to [facility]) 7. Any artificial heart valves, MVP, or defibrillator?no    MEDICATIONS & ALLERGIES:    Patient reports the following regarding taking any anticoagulation/antiplatelet therapy:   Plavix, Coumadin, Eliquis, Xarelto, Lovenox, Pradaxa, Brilinta, or Effient? no Aspirin? yes (81 mg)  Patient confirms/reports the following medications:  Current Outpatient Medications  Medication Sig Dispense Refill   albuterol (VENTOLIN HFA) 108 (90 Base) MCG/ACT inhaler Inhale 2 puffs into the lungs every 6 (six) hours as needed for wheezing or shortness of breath. 1 each 2   bictegravir-emtricitabine-tenofovir AF (BIKTARVY) 50-200-25 MG TABS tablet Take 1 tablet by mouth daily. 30 tablet 1   BIKTARVY 50-200-25 MG TABS tablet Take 1 tablet by mouth daily.     Dextromethorphan-guaiFENesin 20-200 MG/20ML LIQD Take 10 mLs by mouth every 4 (four) hours as needed (chest congestion). 236 mL 0   nicotine (NICODERM CQ - DOSED IN MG/24 HOURS) 14 mg/24hr patch Place 1 patch (14 mg total) onto the skin daily as needed (nicotine craving). 28 patch 0   sulfamethoxazole-trimethoprim (BACTRIM) 400-80 MG tablet Take 1 tablet by mouth daily. 30 tablet 1   tiotropium (SPIRIVA HANDIHALER) 18 MCG inhalation capsule Place 1 capsule (18 mcg total) into inhaler and inhale daily. (Patient not taking: Reported on 04/12/2022) 30 capsule 2   No current facility-administered  medications for this visit.    Patient confirms/reports the following allergies:  No Known Allergies  No orders of the defined types were placed in this encounter.   AUTHORIZATION INFORMATION Primary Insurance: 1D#: Group #:  Secondary Insurance: 1D#: Group #:  SCHEDULE INFORMATION: Date: 07/25/23 Time: Location: [facility]

## 2023-07-15 ENCOUNTER — Telehealth: Payer: Self-pay

## 2023-07-15 NOTE — Telephone Encounter (Signed)
Pulmonary clearance cannot be granted at this time.  Received advice from [facility]  PA- Vernona Rieger [facility] 07/03/23. "Patient Not cleared to have procedure".  " We can't clear based off lung cancer screening appt".  Pt has been notified that we have to post pone her colonoscopy for now.  Thanks,  [place], [place]

## 2023-07-24 ENCOUNTER — Other Ambulatory Visit: Payer: Self-pay

## 2023-07-24 DIAGNOSIS — Z122 Encounter for screening for malignant neoplasm of respiratory organs: Secondary | ICD-10-CM

## 2023-07-24 DIAGNOSIS — F17210 Nicotine dependence, cigarettes, uncomplicated: Secondary | ICD-10-CM

## 2023-07-24 DIAGNOSIS — Z87891 Personal history of nicotine dependence: Secondary | ICD-10-CM

## 2023-07-25 ENCOUNTER — Ambulatory Visit: Admit: 2023-07-25 | Payer: Medicaid Other | Admitting: Gastroenterology

## 2023-07-25 SURGERY — COLONOSCOPY WITH PROPOFOL
Anesthesia: General

## 2023-08-12 DIAGNOSIS — Z0000 Encounter for general adult medical examination without abnormal findings: Secondary | ICD-10-CM | POA: Insufficient documentation

## 2023-08-12 NOTE — Telephone Encounter (Signed)
 Per community message from Pts PCP 08/12/23:  Pt's lung CT was cleared--can you please schedule her colonoscopy?   Thanks!  Noretta (Pt's PCP)   I attempted to contact patient by phone but call did not go through.  Sent her a mychart message to call me back to schedule.  Thanks, [place], CMA

## 2023-11-07 DIAGNOSIS — H539 Unspecified visual disturbance: Secondary | ICD-10-CM | POA: Insufficient documentation

## 2023-11-07 DIAGNOSIS — R413 Other amnesia: Secondary | ICD-10-CM | POA: Insufficient documentation

## 2023-11-07 DIAGNOSIS — M5412 Radiculopathy, cervical region: Secondary | ICD-10-CM | POA: Insufficient documentation

## 2023-12-25 ENCOUNTER — Ambulatory Visit: Payer: MEDICAID | Admitting: Internal Medicine

## 2023-12-25 ENCOUNTER — Encounter: Payer: Self-pay | Admitting: Internal Medicine

## 2023-12-25 VITALS — BP 98/60 | HR 77 | Temp 98.3°F | Ht 65.0 in | Wt 134.2 lb

## 2023-12-25 DIAGNOSIS — J449 Chronic obstructive pulmonary disease, unspecified: Secondary | ICD-10-CM | POA: Diagnosis not present

## 2023-12-25 DIAGNOSIS — F17210 Nicotine dependence, cigarettes, uncomplicated: Secondary | ICD-10-CM

## 2023-12-25 DIAGNOSIS — B20 Human immunodeficiency virus [HIV] disease: Secondary | ICD-10-CM

## 2023-12-25 DIAGNOSIS — Z720 Tobacco use: Secondary | ICD-10-CM

## 2023-12-25 DIAGNOSIS — R0602 Shortness of breath: Secondary | ICD-10-CM | POA: Diagnosis not present

## 2023-12-25 NOTE — Progress Notes (Signed)
 [facility] [place] Pulmonary Medicine Consultation      Date: 12/25/2023,   MRN# [id number] Sandra West 01/12/1966      CHIEF COMPLAINT:    Assessment of COPD  HISTORY OF PRESENT ILLNESS   58 year old pleasant white female with HIV ongoing smoking seen today for establishment of COPD  Patient with ongoing shortness of breath Intermittent wheezing Intermittent productive cough No exacerbation at this time No evidence of heart failure at this time No evidence or signs of infection at this time No respiratory distress No fevers, chills, nausea, vomiting, diarrhea No evidence of lower extremity edema No evidence hemoptysis Patient uses Anoro and Spiriva  seems to help her symptoms  Patient is a current smoker 50-pack-year ongoing tobacco abuse Smoking cessation strongly advised  Patient with a history of pneumonia in the past Patient with a history of HIV Recommend referral to infectious disease for further evaluation Patient being taken care of by [facility] healthcare Unknown HIV status at this time   Sister of patient was on the phone at the time of visit Ambulating pulse oximetry in the office reveals no significant hypoxia however we will need to assess for hypoxia with overnight pulse oximetry  CT chest independently reviewed by me today Low-dose CT chest lung cancer screening protocol reviewed in detail November 2024 Upper lobe predominant emphysematous changes No significant findings for nodules or masses    PAST MEDICAL HISTORY   Past Medical History:  Diagnosis Date   Blocked artery    Left leg   Cancer (HCC)    Cervical   COPD (chronic obstructive pulmonary disease) (HCC)    COPD (chronic obstructive pulmonary disease) (HCC)    emphysema   Emphysema lung (HCC)    HIV (human immunodeficiency virus infection) (HCC) 2022   Migraines    PTSD (post-traumatic stress disorder)      SURGICAL HISTORY   Past Surgical History:  Procedure Laterality  Date   TUBAL LIGATION       FAMILY HISTORY   Family History  Problem Relation Age of Onset   Heart attack Mother    Kidney disease Mother      SOCIAL HISTORY   Social History   Tobacco Use   Smoking status: Some Days    Current packs/day: 1.00    Average packs/day: 1 pack/day for 49.0 years (49.0 ttl pk-yrs)    Types: Cigarettes   Smokeless tobacco: Former  Building services engineer status: Never Used  Substance Use Topics   Alcohol use: Not Currently    Comment: last use 2018   Drug use: Not Currently    Types: Marijuana    Comment: last use 04/11/2022     MEDICATIONS    Home Medication:  Current Outpatient Rx   Order #: [id number] Class: Print   Order #: [id number] Class: Historical Med   Order #: [id number] Class: Historical Med   Order #: [id number] Class: Normal   Order #: [id number] Class: Historical Med   Order #: [id number] Class: Normal   Order #: [id number] Class: Historical Med   Order #: [id number] Class: Historical Med   Order #: [id number] Class: Normal   Order #: [id number] Class: Normal   Order #: [id number] Class: Print    Current Medication:  Current Outpatient Medications:    albuterol  (VENTOLIN  HFA) 108 (90 Base) MCG/ACT inhaler, Inhale 2 puffs into the lungs every 6 (six) hours as needed for wheezing or shortness of breath., Disp: 1 each, Rfl: 2   ANORO ELLIPTA 62.5-25 MCG/ACT AEPB, 1 puff daily., Disp: , Rfl:  aspirin 81 MG chewable tablet, Chew by mouth., Disp: , Rfl:    bictegravir-emtricitabine -tenofovir  AF (BIKTARVY ) 50-200-25 MG TABS tablet, Take 1 tablet by mouth daily., Disp: 30 tablet, Rfl: 1   BIKTARVY  50-200-25 MG TABS tablet, Take 1 tablet by mouth daily., Disp: , Rfl:    Dextromethorphan -guaiFENesin  20-200 MG/20ML LIQD, Take 10 mLs by mouth every 4 (four) hours as needed (chest congestion)., Disp: 236 mL, Rfl: 0   escitalopram (LEXAPRO) 10 MG tablet, Take 10 mg by mouth daily., Disp: , Rfl:    FLONASE ALLERGY RELIEF 50 MCG/ACT nasal spray, Place 2  sprays into both nostrils daily., Disp: , Rfl:    nicotine  (NICODERM CQ  - DOSED IN MG/24 HOURS) 14 mg/24hr patch, Place 1 patch (14 mg total) onto the skin daily as needed (nicotine  craving)., Disp: 28 patch, Rfl: 0   sulfamethoxazole -trimethoprim  (BACTRIM ) 400-80 MG tablet, Take 1 tablet by mouth daily., Disp: 30 tablet, Rfl: 1   tiotropium (SPIRIVA  HANDIHALER) 18 MCG inhalation capsule, Place 1 capsule (18 mcg total) into inhaler and inhale daily. (Patient not taking: Reported on 04/12/2022), Disp: 30 capsule, Rfl: 2    ALLERGIES   Patient has no known allergies.  BP 98/60 (BP Location: Right Arm, Patient Position: Sitting, Cuff Size: Normal)   Pulse 77   Temp 98.3 F (36.8 C) (Oral)   Ht 5\' 5"  (1.651 m)   Wt 134 lb 3.2 oz (60.9 kg)   LMP  (LMP Unknown)   SpO2 99%   BMI 22.33 kg/m     Review of Systems: Gen:  Denies  fever, sweats, chills weight loss  HEENT: Denies blurred vision, double vision, ear pain, eye pain, hearing loss, nose bleeds, sore throat Cardiac:  No dizziness, chest pain or heaviness, chest tightness,edema, No JVD Resp:  + cough, +sputum production, +shortness of breath,+wheezing, -hemoptysis,  Other:  All other systems negative   Physical Examination:   General Appearance: No distress  EYES PERRLA, EOM intact.   NECK Supple, No JVD Pulmonary: normal breath sounds, No wheezing.  CardiovascularNormal S1,S2.  No m/r/g.   Abdomen: Benign, Soft, non-tender. Neurology UE/LE 5/5 strength, no focal deficits Ext pulses intact, cap refill intact ALL OTHER ROS ARE NEGATIVE      ASSESSMENT/PLAN   58 year old white female seen today for diagnosis of HIV and establishment of COPD  Assessment COPD Obtain pulmonary function testing Continue inhalers as prescribed with Anoro and Spiriva  Avoid Allergens and Irritants Avoid secondhand smoke Avoid SICK contacts Recommend  Masking  when appropriate Recommend Keep up-to-date with vaccinations   Smoking  Assessment and Cessation Counseling Upon further questioning, Patient smokes 1 PPD I have advised patient to quit/stop smoking as soon as possible due to high risk for multiple medical problems Patient is willing to quit smoking  I have advised patient that we can assist and have options of Nicotine  replacement therapy. I also advised patient on behavioral therapy and can provide oral medication therapy in conjunction with the other therapies Follow up next Office visit  for assessment of smoking cessation Smoking cessation counseling advised for >10  minutes   Shortness of breath and dyspnea on exertion Obtain overnight pulse oximetry Obtain pulmonary function testing Ambulating pulse oximetry in the office reveals no significant hypoxia    Tobacco abuse Follow-up lung cancer screening program    MEDICATION ADJUSTMENTS/LABS AND TESTS ORDERED: Pulmonary function testing Overnight pulse oximetry Continue inhalers as prescribed Smoking cessation Follow-up lung cancer screening program Referral to infectious diseases for HIV   CURRENT  MEDICATIONS REVIEWED AT LENGTH WITH PATIENT TODAY   Patient  satisfied with Plan of action and management. All questions answered  Follow up 3 MONTHS  I spent a total of 65 minutes reviewing chart data, face-to-face evaluation with the patient, counseling and coordination of care as detailed above.     Lady Pier, M.D.  [facility]  Medical Director [facility] Medical Director [facility] Cardio-Pulmonary Department

## 2023-12-25 NOTE — Patient Instructions (Signed)
 We will obtain pulmonary function testing to assess for lung function We will need to test oxygen levels at nighttime with overnight pulse oximetry Continue inhalers as prescribed for COPD  Avoid Allergens and Irritants Avoid secondhand smoke Avoid SICK contacts Recommend  Masking  when appropriate Recommend Keep up-to-date with vaccinations   Please stop smoking  Follow-up lung cancer screening program  Recommend referral to infectious diseases

## 2024-01-01 ENCOUNTER — Ambulatory Visit: Payer: MEDICAID | Admitting: Internal Medicine

## 2024-01-01 DIAGNOSIS — J449 Chronic obstructive pulmonary disease, unspecified: Secondary | ICD-10-CM | POA: Diagnosis not present

## 2024-01-01 DIAGNOSIS — R0602 Shortness of breath: Secondary | ICD-10-CM

## 2024-01-01 LAB — PULMONARY FUNCTION TEST
DL/VA % pred: 87 %
DL/VA: 3.66 ml/min/mmHg/L
DLCO unc % pred: 86 %
DLCO unc: 18.07 ml/min/mmHg
FEF 25-75 Post: 1.21 L/s
FEF 25-75 Pre: 1.57 L/s
FEF2575-%Change-Post: -22 %
FEF2575-%Pred-Post: 49 %
FEF2575-%Pred-Pre: 64 %
FEV1-%Change-Post: -5 %
FEV1-%Pred-Post: 80 %
FEV1-%Pred-Pre: 85 %
FEV1-Post: 2.17 L
FEV1-Pre: 2.29 L
FEV1FVC-%Change-Post: -1 %
FEV1FVC-%Pred-Pre: 91 %
FEV6-%Change-Post: -4 %
FEV6-%Pred-Post: 90 %
FEV6-%Pred-Pre: 94 %
FEV6-Post: 3.04 L
FEV6-Pre: 3.16 L
FEV6FVC-%Change-Post: 0 %
FEV6FVC-%Pred-Post: 102 %
FEV6FVC-%Pred-Pre: 102 %
FVC-%Change-Post: -3 %
FVC-%Pred-Post: 89 %
FVC-%Pred-Pre: 92 %
FVC-Post: 3.08 L
FVC-Pre: 3.2 L
Post FEV1/FVC ratio: 70 %
Post FEV6/FVC ratio: 99 %
Pre FEV1/FVC ratio: 72 %
Pre FEV6/FVC Ratio: 99 %
RV % pred: 123 %
RV: 2.48 L
TLC % pred: 106 %
TLC: 5.53 L

## 2024-01-01 NOTE — Patient Instructions (Signed)
 Full PFT completed today ? ?

## 2024-01-01 NOTE — Progress Notes (Signed)
 Full PFT completed today ? ?

## 2024-01-13 ENCOUNTER — Telehealth: Payer: Self-pay

## 2024-01-13 NOTE — Telephone Encounter (Addendum)
 ONO reviewed by Dr. Auston Left- Low SpO2 88%. No oxygen needed.  I tried to contact the patient. I did not get an answer and he voicemail was not set up. I will try again later.

## 2024-01-15 NOTE — Telephone Encounter (Signed)
NA. No voicemail.  

## 2024-01-22 NOTE — Telephone Encounter (Signed)
 Letter has been mailed.  Closing this encounter per office protocol.

## 2024-01-24 ENCOUNTER — Encounter: Payer: Self-pay | Admitting: Internal Medicine

## 2024-01-30 NOTE — Progress Notes (Unsigned)
 Referring Physician:  Osa Geralds, NP [address] [place],  [place] [address]  Primary Physician:  [facility]  History of Present Illness: 02/05/2024 Ms. Sandra West has a history of COPD, emphysema, history of hep C, history of cocaine abuse, PTSD, HIV. She has been clean for about 8 months.   6 month history of intermittent numbness/tingling in both arms, right > left that is worse in the morning. She has weakness in arms when she has numbness/tingling. No alleviating factors. No known injury.   She has constant numbness in last two toes on right foot x 1 year,  she has intermittent numbness in left foot x 6 months.   No neck pain. No arm or leg pain. No known injury. History of intermittent LBP x 1 year that is tolerable.   She smokes 1 PPD x 49 years.   Bowel/Bladder Dysfunction: none  Conservative measures:  Physical therapy: has not participated in PT  Multimodal medical therapy including regular antiinflammatories: none. Injections: no epidural steroid injections  Past Surgery: no spinal surgeries.   Sandra West has no symptoms of cervical myelopathy.  The symptoms are causing a significant impact on the patient's life.   Review of Systems:  A 10 point review of systems is negative, except for the pertinent positives and negatives detailed in the HPI.  Past Medical History: Past Medical History:  Diagnosis Date   Blocked artery    Left leg   Cancer (HCC)    Cervical   COPD (chronic obstructive pulmonary disease) (HCC)    COPD (chronic obstructive pulmonary disease) (HCC)    emphysema   Emphysema lung (HCC)    HIV (human immunodeficiency virus infection) (HCC) 2022   Migraines    PTSD (post-traumatic stress disorder)     Past Surgical History: Past Surgical History:  Procedure Laterality Date   cevical surgery     laser surgery due to cervical cancer- in late 1990s   TUBAL LIGATION      Allergies: Allergies as of  02/05/2024   (No Known Allergies)    Medications: Outpatient Encounter Medications as of 02/05/2024  Medication Sig   albuterol  (VENTOLIN  HFA) 108 (90 Base) MCG/ACT inhaler Inhale 2 puffs into the lungs every 6 (six) hours as needed for wheezing or shortness of breath.   ANORO ELLIPTA 62.5-25 MCG/ACT AEPB 1 puff daily.   aspirin 81 MG chewable tablet Chew by mouth.   BACTRIM  DS 800-160 MG tablet Take 1 tablet by mouth daily.   bictegravir-emtricitabine -tenofovir  AF (BIKTARVY ) 50-200-25 MG TABS tablet Take 1 tablet by mouth daily.   BIKTARVY  50-200-25 MG TABS tablet Take 1 tablet by mouth daily.   Dextromethorphan -guaiFENesin  20-200 MG/20ML LIQD Take 10 mLs by mouth every 4 (four) hours as needed (chest congestion).   escitalopram (LEXAPRO) 20 MG tablet Take 20 mg by mouth daily.   FLONASE ALLERGY RELIEF 50 MCG/ACT nasal spray Place 2 sprays into both nostrils daily.   glucose blood ([employer] BLOOD GLUCOSE TEST) test strip 1 each as needed.   Lancets (STERILANCE TL) MISC STERILANCE TL   naloxone (NARCAN) nasal spray 4 mg/0.1 mL Place 1 spray into the nose once.   nicotine  (NICODERM CQ  - DOSED IN MG/24 HOURS) 14 mg/24hr patch Place 1 patch (14 mg total) onto the skin daily as needed (nicotine  craving). (Patient not taking: Reported on 12/25/2023)   tiotropium (SPIRIVA  HANDIHALER) 18 MCG inhalation capsule Place 1 capsule (18 mcg total) into inhaler and inhale daily. (Patient not taking:  Reported on 04/12/2022)   No facility-administered encounter medications on file as of 02/05/2024.    Social History: Social History   Tobacco Use   Smoking status: Every Day    Current packs/day: 1.00    Average packs/day: 1 pack/day for 49.0 years (49.0 ttl pk-yrs)    Types: Cigarettes   Smokeless tobacco: Former   Tobacco comments:    Started smoking at 58yrs old. Used to smoke 1.5ppd but has cut down to 0.5 ppd. 12/25/23  Vaping Use   Vaping status: Never Used  Substance Use Topics   Alcohol use: Not  Currently    Comment: last use 2018   Drug use: Not Currently    Types: Marijuana, Crack cocaine    Comment: last use 04/11/2022    Family Medical History: Family History  Problem Relation Age of Onset   Heart attack Mother    Kidney disease Mother     Physical Examination: Vitals:   02/05/24 0923  BP: 110/72    General: Patient is well developed, well nourished, calm, collected, and in no apparent distress. Attention to examination is appropriate.  Respiratory: Patient is breathing without any difficulty.   NEUROLOGICAL:     Awake, alert, oriented to person, place, and time.  Speech is clear and fluent. Fund of knowledge is appropriate.   Cranial Nerves: Pupils equal round and reactive to light.  Facial tone is symmetric.    No abnormal lesions on exposed skin.   Strength: Side Biceps Triceps Deltoid Interossei Grip Wrist Ext. Wrist Flex.  R 5 5 5 5 5 5 5   L 5 5 5 5 5 5 5    Side Iliopsoas Quads Hamstring PF DF EHL  R 5 5 5 5 5 5   L 5 5 5 5 5 5    Reflexes are 2+ and symmetric at the biceps, brachioradialis, patella and achilles.   Hoffman's is absent.  Clonus is not present.   Bilateral upper and lower extremity sensation is intact to light touch.     No pain with IR/ER of both hips.   She has negative tinels at wrist and elbow bilaterally. Negative phalens at wrist bilaterally.   Gait is normal.     Medical Decision Making  Imaging: No cervical or lumbar imaging.   Assessment and Plan: Ms. Demauro has a 6 month history of intermittent numbness/tingling in both arms, right > left that is worse in the morning. She has weakness in arms when she has numbness/tingling. No alleviating factors. No known injury.   She has constant numbness in last two toes on right foot x 1 year,  she has intermittent numbness in left foot x 6 months.   No neck pain. No arm or leg pain. No known injury. History of intermittent LBP x 1 year that is tolerable.   No cervical or  lumbar imaging.   Symptoms appear to be more peripheral nerve mediated than cervical or lumbar mediated.   Treatment options discussed with patient and following plan made:   - Referral to neurology at [facility]) for EMG/NCS of bilateral upper and lower extremities.  - She has transportation issues, will schedule phone visit with me to discuss EMG results.  - Of note, she has been clean for last 8 months.   I spent a total of 30 minutes in face-to-face and non-face-to-face activities related to this patient's care today including review of outside records, review of imaging, review of symptoms, physical exam, discussion of differential diagnosis, discussion of  treatment options, and documentation.   Thank you for involving me in the care of this patient.   Glade Boys PA-C Dept. of Neurosurgery

## 2024-02-05 ENCOUNTER — Encounter: Payer: Self-pay | Admitting: Orthopedic Surgery

## 2024-02-05 ENCOUNTER — Telehealth: Payer: Self-pay

## 2024-02-05 ENCOUNTER — Ambulatory Visit: Payer: MEDICAID | Admitting: Orthopedic Surgery

## 2024-02-05 ENCOUNTER — Telehealth: Payer: Self-pay | Admitting: Orthopedic Surgery

## 2024-02-05 VITALS — BP 110/72 | Ht 65.0 in | Wt 135.0 lb

## 2024-02-05 DIAGNOSIS — R200 Anesthesia of skin: Secondary | ICD-10-CM | POA: Diagnosis not present

## 2024-02-05 DIAGNOSIS — R202 Paresthesia of skin: Secondary | ICD-10-CM | POA: Diagnosis not present

## 2024-02-05 NOTE — Telephone Encounter (Signed)
 Patient advised of ONO results. NFN.

## 2024-02-05 NOTE — Telephone Encounter (Signed)
 Order has been faxed

## 2024-02-05 NOTE — Telephone Encounter (Addendum)
 Patient is calling cause she received a letter about test results and the letter said to call about results . Calling cal so patient can speak with someone concerning the letter she received . They picked up . Please give patient a call concerning the letyter she received about results . phone but placed me on hold . Waiting for them to pick up . Still on hold waiting for someone to pick up . Called cal line back and they picked up and speaking with someone for patient and they are getting I guess a nurse to speak with patient about results. Got patient transferred to kim  [phone]

## 2024-02-05 NOTE — Telephone Encounter (Signed)
 Ordered EMG/NCS of bilateral upper and lower extremities with Dr. Maree at [facility].   Thanks!

## 2024-02-05 NOTE — Patient Instructions (Signed)
 It was so nice to see you today. Thank you so much for coming in.    I want you to see neurology at the [facility] clinic (Dr. Maree) for a nerve test (EMG) of both arms and both legs. They should call you to schedule an appointment or you can call them at [phone].   Once I have the results, we will call you to schedule a follow up phone visit with me to review them.   Please do not hesitate to call if you have any questions or concerns. You can also message me in MyChart.   Glade Boys PA-C [phone]     The physicians and staff at [facility] at [facility] are committed to providing excellent care. You may receive a survey asking for feedback about your experience at our office. We value you your feedback and appreciate you taking the time to to fill it out. The [facility] leadership team is also available to discuss your experience in person, feel free to contact us  [phone].

## 2024-02-06 NOTE — Telephone Encounter (Signed)
 Message from [facility] Neurology: they reached out to the patient to schedule, had to leave a voicemail

## 2024-03-05 LAB — GLUCOSE, POCT (MANUAL RESULT ENTRY): POC Glucose: 112 mg/dL — AB (ref 70–99)

## 2024-03-05 NOTE — Congregational Nurse Program (Signed)
  Dept: [phone]   Congregational Nurse Program Note  Date of Encounter: 03/05/2024  Past Medical History: Past Medical History:  Diagnosis Date   Blocked artery    Left leg   Cancer (HCC)    Cervical   COPD (chronic obstructive pulmonary disease) (HCC)    COPD (chronic obstructive pulmonary disease) (HCC)    emphysema   Emphysema lung (HCC)    HIV (human immunodeficiency virus infection) (HCC) 2022   Migraines    PTSD (post-traumatic stress disorder)     Encounter Details:  Community Questionnaire - 03/05/24 1306       Questionnaire   Ask client: Do you give verbal consent for me to treat you today? Yes    Student Assistance N/A    Location Patient Served  [facility]    Encounter Setting CN site    Population Status Unknown    Insurance Medicaid    Insurance/Financial Assistance Referral N/A    Medication N/A    Medical Provider Yes    Screening Referrals Made N/A    Medical Referrals Made N/A    Medical Appointment Completed N/A    CNP Interventions Advocate/Support    Screenings CN Performed Blood Pressure;Blood Glucose    ED Visit Averted N/A    Life-Saving Intervention Made N/A         Today's Vitals   03/05/24 1306  BP: 119/79  Pulse: 90  SpO2: 97%   There is no height or weight on file to calculate BMI.  Patient inquired about a walker/ rollator.  Patient was given resource/ phone # for [employer] to get help with this.

## 2024-03-17 ENCOUNTER — Telehealth: Payer: Self-pay

## 2024-03-17 NOTE — Telephone Encounter (Signed)
 Copied from CRM #[id number]. Topic: Clinical - Lab/Test Results >> Mar 17, 2024  2:47 PM Isabell A wrote: Reason for CRM: Patient is calling to go over her most recent lab results on 7/31.  Callback number: [phone]

## 2024-03-26 ENCOUNTER — Ambulatory Visit: Admitting: Internal Medicine

## 2024-03-26 NOTE — Progress Notes (Deleted)
 [facility] Pulmonary Medicine Consultation      Date: 03/26/2024,   MRN# [id number] Sandra West Jun 22, 1966  Sandra West is a 58 y.o. old female seen in consultation for *** at the request of ***.     CHIEF COMPLAINT:      HISTORY OF PRESENT ILLNESS      PAST MEDICAL HISTORY   Past Medical History:  Diagnosis Date   Blocked artery    Left leg   Cancer (HCC)    Cervical   COPD (chronic obstructive pulmonary disease) (HCC)    COPD (chronic obstructive pulmonary disease) (HCC)    emphysema   Emphysema lung (HCC)    HIV (human immunodeficiency virus infection) (HCC) 2022   Migraines    PTSD (post-traumatic stress disorder)      SURGICAL HISTORY   Past Surgical History:  Procedure Laterality Date   cevical surgery     laser surgery due to cervical cancer- in late 1990s   TUBAL LIGATION       FAMILY HISTORY   Family History  Problem Relation Age of Onset   Heart attack Mother    Kidney disease Mother      SOCIAL HISTORY   Social History   Tobacco Use   Smoking status: Every Day    Current packs/day: 1.00    Average packs/day: 1 pack/day for 49.0 years (49.0 ttl pk-yrs)    Types: Cigarettes   Smokeless tobacco: Former   Tobacco comments:    Started smoking at 57yrs old. Used to smoke 1.5ppd but has cut down to 0.5 ppd. 12/25/23  Vaping Use   Vaping status: Never Used  Substance Use Topics   Alcohol use: Not Currently    Comment: last use 2018   Drug use: Not Currently    Types: Marijuana, Crack cocaine    Comment: last use 04/11/2022     MEDICATIONS    Home Medication:  Current Outpatient Rx   Order #: [id number] Class: Print   Order #: [id number] Class: Historical Med   Order #: [id number] Class: Normal   Order #: [id number] Class: Historical Med   Order #: [id number] Class: Historical Med   Order #: [id number] Class: Historical Med    Current Medication:  Current Outpatient Medications:    albuterol  (VENTOLIN  HFA) 108 (90  Base) MCG/ACT inhaler, Inhale 2 puffs into the lungs every 6 (six) hours as needed for wheezing or shortness of breath., Disp: 1 each, Rfl: 2   ANORO ELLIPTA 62.5-25 MCG/ACT AEPB, 1 puff daily., Disp: , Rfl:    bictegravir-emtricitabine -tenofovir  AF (BIKTARVY ) 50-200-25 MG TABS tablet, Take 1 tablet by mouth daily., Disp: 30 tablet, Rfl: 1   escitalopram (LEXAPRO) 20 MG tablet, Take 20 mg by mouth daily., Disp: , Rfl:    FLONASE ALLERGY RELIEF 50 MCG/ACT nasal spray, Place 2 sprays into both nostrils daily., Disp: , Rfl:    naloxone (NARCAN) nasal spray 4 mg/0.1 mL, Place 1 spray into the nose once., Disp: , Rfl:     ALLERGIES   Patient has no known allergies.   LMP  (LMP Unknown)    Review of Systems: Gen:  Denies  fever, sweats, chills weight loss  HEENT: Denies blurred vision, double vision, ear pain, eye pain, hearing loss, nose bleeds, sore throat Cardiac:  No dizziness, chest pain or heaviness, chest tightness,edema, No JVD Resp:   No cough, -sputum production, -shortness of breath,-wheezing, -hemoptysis,  Other:  All other systems negative   Physical Examination:   General Appearance: No distress  EYES  PERRLA, EOM intact.   NECK Supple, No JVD Pulmonary: normal breath sounds, No wheezing.  CardiovascularNormal S1,S2.  No m/r/g.   Abdomen: Benign, Soft, non-tender. Neurology UE/LE 5/5 strength, no focal deficits Ext pulses intact, cap refill intact ALL OTHER ROS ARE NEGATIVE      IMAGING    No results found.    ASSESSMENT/PLAN                MEDICATION ADJUSTMENTS/LABS AND TESTS ORDERED:    CURRENT MEDICATIONS REVIEWED AT LENGTH WITH PATIENT TODAY   Patient  satisfied with Plan of action and management. All questions answered   Follow up    I spent a total of *** minutes dedicated to the care of this patient on the date of this encounter to include pre-visit review of records, face-to-face time with the patient discussing conditions  above, post visit ordering of testing, clinical documentation with the electronic health record, making appropriate referrals as documented, and communicating necessary information to the patient's healthcare team.    The Patient requires high complexity decision making for assessment and support, frequent evaluation and titration of therapies, application of advanced monitoring technologies and extensive interpretation of multiple databases.  Patient satisfied with Plan of action and management. All questions answered    Nickolas Alm Cellar, M.D.  [facility]  Medical Director [facility] Medical Director [facility] Cardio-Pulmonary Department

## 2024-03-26 NOTE — Progress Notes (Deleted)
 [facility] [place] [facility] Consultation      Date: 03/26/2024,   MRN# [id number] Sandra West 12/14/1965      CHIEF COMPLAINT:    Assessment of COPD  HISTORY OF PRESENT ILLNESS   58 year old pleasant white female with HIV ongoing smoking seen today for establishment of COPD  Patient with ongoing shortness of breath Intermittent wheezing Intermittent productive cough No exacerbation at this time No evidence of heart failure at this time No evidence or signs of infection at this time No respiratory distress No fevers, chills, nausea, vomiting, diarrhea No evidence of lower extremity edema No evidence hemoptysis Patient uses Anoro and Spiriva  seems to help her symptoms  Patient is a current smoker 50-pack-year ongoing tobacco abuse Smoking cessation strongly advised  Patient with a history of pneumonia in the past Patient with a history of HIV Recommend referral to infectious disease for further evaluation Patient being taken care of by [facility] healthcare Unknown HIV status at this time   Sister of patient was on the phone at the time of visit Ambulating pulse oximetry in the office reveals no significant hypoxia however we will need to assess for hypoxia with overnight pulse oximetry  CT chest independently reviewed by me today Low-dose CT chest lung cancer screening protocol reviewed in detail November 2024 Upper lobe predominant emphysematous changes No significant findings for nodules or masses    PAST MEDICAL HISTORY   Past Medical History:  Diagnosis Date   Blocked artery    Left leg   Cancer (HCC)    Cervical   COPD (chronic obstructive pulmonary disease) (HCC)    COPD (chronic obstructive pulmonary disease) (HCC)    emphysema   Emphysema lung (HCC)    HIV (human immunodeficiency virus infection) (HCC) 2022   Migraines    PTSD (post-traumatic stress disorder)      SURGICAL HISTORY   Past Surgical History:  Procedure Laterality  Date   cevical surgery     laser surgery due to cervical cancer- in late 1990s   TUBAL LIGATION       FAMILY HISTORY   Family History  Problem Relation Age of Onset   Heart attack Mother    Kidney disease Mother      SOCIAL HISTORY   Social History   Tobacco Use   Smoking status: Every Day    Current packs/day: 1.00    Average packs/day: 1 pack/day for 49.0 years (49.0 ttl pk-yrs)    Types: Cigarettes   Smokeless tobacco: Former   Tobacco comments:    Started smoking at 58yrs old. Used to smoke 1.5ppd but has cut down to 0.5 ppd. 12/25/23  Vaping Use   Vaping status: Never Used  Substance Use Topics   Alcohol use: Not Currently    Comment: last use 2018   Drug use: Not Currently    Types: Marijuana, Crack cocaine    Comment: last use 04/11/2022     MEDICATIONS    Home Medication:  Current Outpatient Rx   Order #: [id number] Class: Print   Order #: [id number] Class: Historical Med   Order #: [id number] Class: Normal   Order #: [id number] Class: Historical Med   Order #: [id number] Class: Historical Med   Order #: [id number] Class: Historical Med    Current Medication:  Current Outpatient Medications:    albuterol  (VENTOLIN  HFA) 108 (90 Base) MCG/ACT inhaler, Inhale 2 puffs into the lungs every 6 (six) hours as needed for wheezing or shortness of breath., Disp: 1 each, Rfl: 2   ANORO  ELLIPTA 62.5-25 MCG/ACT AEPB, 1 puff daily., Disp: , Rfl:    bictegravir-emtricitabine -tenofovir  AF (BIKTARVY ) 50-200-25 MG TABS tablet, Take 1 tablet by mouth daily., Disp: 30 tablet, Rfl: 1   escitalopram (LEXAPRO) 20 MG tablet, Take 20 mg by mouth daily., Disp: , Rfl:    FLONASE ALLERGY RELIEF 50 MCG/ACT nasal spray, Place 2 sprays into both nostrils daily., Disp: , Rfl:    naloxone (NARCAN) nasal spray 4 mg/0.1 mL, Place 1 spray into the nose once., Disp: , Rfl:     ALLERGIES   Patient has no known allergies.  LMP  (LMP Unknown)     Review of Systems: Gen:  Denies  fever,  sweats, chills weight loss  HEENT: Denies blurred vision, double vision, ear pain, eye pain, hearing loss, nose bleeds, sore throat Cardiac:  No dizziness, chest pain or heaviness, chest tightness,edema, No JVD Resp:  + cough, +sputum production, +shortness of breath,+wheezing, -hemoptysis,  Other:  All other systems negative   Physical Examination:   General Appearance: No distress  EYES PERRLA, EOM intact.   NECK Supple, No JVD Pulmonary: normal breath sounds, No wheezing.  CardiovascularNormal S1,S2.  No m/r/g.   Abdomen: Benign, Soft, non-tender. Neurology UE/LE 5/5 strength, no focal deficits Ext pulses intact, cap refill intact ALL OTHER ROS ARE NEGATIVE      ASSESSMENT/PLAN   58 year old white female seen today for diagnosis of HIV and establishment of COPD   Assessment & Plan Chronic obstructive pulmonary disease, unspecified COPD type (HCC)  Tobacco abuse Smoking Assessment and Cessation Counseling Upon further questioning, Patient smokes *** I have advised patient to quit/stop smoking as soon as possible due to high risk for multiple medical problems Patient is willing to quit smoking *** is NOT willing to quit smoking I have advised patient that we can assist and have options of Nicotine  replacement therapy. I also advised patient on behavioral therapy and can provide oral medication therapy in conjunction with the other therapies Follow up next Office visit  for assessment of smoking cessation Smoking cessation counseling advised for 4 minutes  Currently asymptomatic HIV infection, with history of HIV-related illness (HCC)       Assessment COPD Obtain pulmonary function testing Continue inhalers as prescribed with Anoro and Spiriva  Avoid Allergens and Irritants Avoid secondhand smoke Avoid SICK contacts Recommend  Masking  when appropriate Recommend Keep up-to-date with vaccinations   Smoking Assessment and Cessation Counseling Upon further  questioning, Patient smokes 1 PPD I have advised patient to quit/stop smoking as soon as possible due to high risk for multiple medical problems Patient is willing to quit smoking  I have advised patient that we can assist and have options of Nicotine  replacement therapy. I also advised patient on behavioral therapy and can provide oral medication therapy in conjunction with the other therapies Follow up next Office visit  for assessment of smoking cessation Smoking cessation counseling advised for >10  minutes   Shortness of breath and dyspnea on exertion Obtain overnight pulse oximetry Obtain pulmonary function testing Ambulating pulse oximetry in the office reveals no significant hypoxia    Tobacco abuse Follow-up lung cancer screening program    MEDICATION ADJUSTMENTS/LABS AND TESTS ORDERED: Pulmonary function testing Overnight pulse oximetry Continue inhalers as prescribed Smoking cessation Follow-up lung cancer screening program Referral to infectious diseases for HIV    MEDICATION ADJUSTMENTS/LABS AND TESTS ORDERED:    CURRENT MEDICATIONS REVIEWED AT LENGTH WITH PATIENT TODAY   Patient  satisfied with Plan of action and management.  All questions answered   Follow up    I spent a total of *** minutes dedicated to the care of this patient on the date of this encounter to include pre-visit review of records, face-to-face time with the patient discussing conditions above, post visit ordering of testing, clinical documentation with the electronic health record, making appropriate referrals as documented, and communicating necessary information to the patient's healthcare team.    The Patient requires high complexity decision making for assessment and support, frequent evaluation and titration of therapies, application of advanced monitoring technologies and extensive interpretation of multiple databases.  Patient satisfied with Plan of action and management. All  questions answered    Nickolas Alm Cellar, M.D.  [facility]  Medical Director [facility] Medical Director [facility] Cardio-Pulmonary Department

## 2024-03-29 ENCOUNTER — Emergency Department: Payer: MEDICAID

## 2024-03-29 ENCOUNTER — Emergency Department
Admission: EM | Admit: 2024-03-29 | Discharge: 2024-03-29 | Disposition: A | Discharge status: Home / Self Care | Payer: MEDICAID | Attending: Emergency Medicine | Admitting: Emergency Medicine

## 2024-03-29 DIAGNOSIS — R0789 Other chest pain: Secondary | ICD-10-CM | POA: Diagnosis present

## 2024-03-29 DIAGNOSIS — J449 Chronic obstructive pulmonary disease, unspecified: Secondary | ICD-10-CM | POA: Insufficient documentation

## 2024-03-29 LAB — CBC
HCT: 37.1 % (ref 36.0–46.0)
Hemoglobin: 12.9 g/dL (ref 12.0–15.0)
MCH: 32.3 pg (ref 26.0–34.0)
MCHC: 34.8 g/dL (ref 30.0–36.0)
MCV: 92.8 fL (ref 80.0–100.0)
Platelets: 217 K/uL (ref 150–400)
RBC: 4 MIL/uL (ref 3.87–5.11)
RDW: 13.2 % (ref 11.5–15.5)
WBC: 4.6 K/uL (ref 4.0–10.5)
nRBC: 0 % (ref 0.0–0.2)

## 2024-03-29 LAB — BASIC METABOLIC PANEL WITH GFR
Anion gap: 8 (ref 5–15)
BUN: 15 mg/dL (ref 6–20)
CO2: 24 mmol/L (ref 22–32)
Calcium: 8.8 mg/dL — ABNORMAL LOW (ref 8.9–10.3)
Chloride: 106 mmol/L (ref 98–111)
Creatinine, Ser: 0.79 mg/dL (ref 0.44–1.00)
GFR, Estimated: >60 mL/min (ref >60)
Glucose, Bld: 123 mg/dL — ABNORMAL HIGH (ref 70–99)
Potassium: 3.5 mmol/L (ref 3.5–5.1)
Sodium: 138 mmol/L (ref 135–145)

## 2024-03-29 LAB — TROPONIN I (HIGH SENSITIVITY): Troponin I (High Sensitivity): 3 ng/L (ref <18)

## 2024-03-29 MED ORDER — DOXYCYCLINE HYCLATE 100 MG PO TABS: 100.0000 mg | ORAL_TABLET | Freq: Two times a day (BID) | ORAL | 0 refills | Status: AC

## 2024-03-29 MED ORDER — NAPROXEN 500 MG PO TABS: 500.0000 mg | ORAL_TABLET | Freq: Two times a day (BID) | ORAL | 2 refills | Status: AC

## 2024-03-29 MED ORDER — DOXYCYCLINE HYCLATE 100 MG PO TABS
100.0000 mg | ORAL_TABLET | Freq: Once | ORAL | Status: AC
  Administered 2024-03-29: 100 mg via ORAL
  Filled 2024-03-29: qty 1

## 2024-03-29 MED ORDER — KETOROLAC TROMETHAMINE 30 MG/ML IJ SOLN
30.0000 mg | Freq: Once | INTRAMUSCULAR | Status: AC
  Administered 2024-03-29: 30 mg via INTRAMUSCULAR
  Filled 2024-03-29: qty 1

## 2024-03-29 NOTE — ED Triage Notes (Signed)
 Pt presents to the ED with right-sided rib cage pain since last night. Pt has significant respiratory hx and only has about 20% function in both lungs per patient. Pt reports chronic cough

## 2024-03-29 NOTE — ED Provider Notes (Signed)
 [facility] Provider Note    Event Date/Time   First MD Initiated Contact with Patient 03/29/24 1410     (approximate)   History   Chest discomfort   HPI  Sandra West is a 58 y.o. female with history of COPD who presents with complaints of right lower chest discomfort.  She reports it has been ongoing for 2 to 3 days, she does have tenderness to palpation in the area, no rash reported.  No fevers.  She reports her breathing is at baseline     Physical Exam   Triage Vital Signs: ED Triage Vitals  Encounter Vitals Group     BP 03/29/24 1238 98/67     Girls Systolic BP Percentile --      Girls Diastolic BP Percentile --      Boys Systolic BP Percentile --      Boys Diastolic BP Percentile --      Pulse Rate 03/29/24 1238 87     Resp 03/29/24 1238 18     Temp 03/29/24 1238 98 F (36.7 C)     Temp Source 03/29/24 1238 Oral     SpO2 03/29/24 1238 100 %     Weight 03/29/24 1236 60.8 kg (134 lb)     Height 03/29/24 1236 1.676 m (5' 6)     Head Circumference --      Peak Flow --      Pain Score 03/29/24 1235 10     Pain Loc --      Pain Education --      Exclude from Growth Chart --     Most recent vital signs: Vitals:   03/29/24 1238 03/29/24 1506  BP: 98/67 108/72  Pulse: 87 84  Resp: 18 17  Temp: 98 F (36.7 C) 98.1 F (36.7 C)  SpO2: 100% 99%     General: Awake, no distress.  CV:  Good peripheral perfusion.  Regular rate and rhythm Resp:  Normal effort.  Scattered mild wheezing no acute distress Abd:  No distention.  Other:  No chest wall rash to suggest shingles   ED Results / Procedures / Treatments   Labs (all labs ordered are listed, but only abnormal results are displayed) Labs Reviewed  BASIC METABOLIC PANEL WITH GFR - Abnormal; Notable for the following components:      Result Value   Glucose, Bld 123 (*)    Calcium 8.8 (*)    All other components within normal limits  CBC  TROPONIN I (HIGH SENSITIVITY)      EKG  ED ECG REPORT I, Lamar Price, the attending physician, personally viewed and interpreted this ECG.  Date: 03/29/2024  Rhythm: normal sinus rhythm QRS Axis: normal Intervals: normal ST/T Wave abnormalities: normal Narrative Interpretation: no evidence of acute ischemia    RADIOLOGY Chest x-ray viewed interpret by me, no acute abnormality    PROCEDURES:  Critical Care performed:   Procedures   MEDICATIONS ORDERED IN ED: Medications  ketorolac  (TORADOL ) 30 MG/ML injection 30 mg (30 mg Intramuscular Given 03/29/24 1459)  doxycycline  (VIBRA -TABS) tablet 100 mg (100 mg Oral Given 03/29/24 1458)     IMPRESSION / MDM / ASSESSMENT AND PLAN / ED COURSE  I reviewed the triage vital signs and the nursing notes. Patient's presentation is most consistent with acute presentation with potential threat to life or bodily function.  Patient presents with chest discomfort as detailed above, right lower chest, differential includes ACS, chest wall injury, shingles, pneumonia  EKG, high sensitive troponin are normal, HPI not consistent with ACS.  Chest x-ray without evidence of pneumonia or pneumothorax.  Lab work reviewed and is reassuring.  No evidence of shingles on exam, will treat with NSAIDs, antibiotics in the case of developing pneumonia        FINAL CLINICAL IMPRESSION(S) / ED DIAGNOSES   Final diagnoses:  Chest wall pain     Rx / DC Orders   ED Discharge Orders          Ordered    naproxen  (NAPROSYN ) 500 MG tablet  2 times daily with meals        03/29/24 1454    doxycycline  (VIBRA -TABS) 100 MG tablet  2 times daily        03/29/24 1454             Note:  This document was prepared using Dragon voice recognition software and may include unintentional dictation errors.   Arlander Charleston, MD 03/29/24 [phone]

## 2024-03-31 ENCOUNTER — Encounter: Payer: Self-pay | Admitting: Internal Medicine

## 2024-03-31 ENCOUNTER — Ambulatory Visit: Admitting: Internal Medicine

## 2024-03-31 NOTE — Progress Notes (Deleted)
 [facility]monary Medicine Consultation      Date: 03/31/2024,   MRN# [id number] Sandra West Oct 12, 1965      CHIEF COMPLAINT:    Assessment of COPD  HISTORY OF PRESENT ILLNESS   58 year old pleasant white female with HIV ongoing smoking seen today for establishment of COPD  Patient with ongoing shortness of breath Intermittent wheezing Intermittent productive cough No exacerbation at this time No evidence of heart failure at this time No evidence or signs of infection at this time No respiratory distress No fevers, chills, nausea, vomiting, diarrhea No evidence of lower extremity edema No evidence hemoptysis Patient uses Anoro and Spiriva  seems to help her symptoms  Patient is a current smoker 50-pack-year ongoing tobacco abuse Smoking cessation strongly advised  Patient with a history of pneumonia in the past Patient with a history of HIV Recommend referral to infectious disease for further evaluation Patient being taken care of by [facility] healthcare Unknown HIV status at this time   Sister of patient was on the phone at the time of visit Ambulating pulse oximetry in the office reveals no significant hypoxia however we will need to assess for hypoxia with overnight pulse oximetry  CT chest independently reviewed by me today Low-dose CT chest lung cancer screening protocol reviewed in detail November 2024 Upper lobe predominant emphysematous changes No significant findings for nodules or masses    PAST MEDICAL HISTORY   Past Medical History:  Diagnosis Date   Blocked artery    Left leg   Cancer (HCC)    Cervical   COPD (chronic obstructive pulmonary disease) (HCC)    COPD (chronic obstructive pulmonary disease) (HCC)    emphysema   Emphysema lung (HCC)    HIV (human immunodeficiency virus infection) (HCC) 2022   Migraines    PTSD (post-traumatic stress disorder)      SURGICAL HISTORY   Past Surgical History:  Procedure Laterality  Date   cevical surgery     laser surgery due to cervical cancer- in late 1990s   TUBAL LIGATION       FAMILY HISTORY   Family History  Problem Relation Age of Onset   Heart attack Mother    Kidney disease Mother      SOCIAL HISTORY   Social History   Tobacco Use   Smoking status: Every Day    Current packs/day: 1.00    Average packs/day: 1 pack/day for 49.0 years (49.0 ttl pk-yrs)    Types: Cigarettes   Smokeless tobacco: Former   Tobacco comments:    Started smoking at 58yrs old. Used to smoke 1.5ppd but has cut down to 0.5 ppd. 12/25/23  Vaping Use   Vaping status: Never Used  Substance Use Topics   Alcohol use: Not Currently    Comment: last use 2018   Drug use: Not Currently    Types: Marijuana, Crack cocaine    Comment: last use 04/11/2022     MEDICATIONS    Home Medication:  Current Outpatient Rx   Order #: [id number] Class: Print   Order #: [id number] Class: Historical Med   Order #: [id number] Class: Normal   Order #: [id number] Class: Normal   Order #: [id number] Class: Historical Med   Order #: [id number] Class: Historical Med   Order #: [id number] Class: Historical Med   Order #: [id number] Class: Normal    Current Medication:  Current Outpatient Medications:    albuterol  (VENTOLIN  HFA) 108 (90 Base) MCG/ACT inhaler, Inhale 2 puffs into the lungs every 6 (six) hours as needed for wheezing  or shortness of breath., Disp: 1 each, Rfl: 2   ANORO ELLIPTA 62.5-25 MCG/ACT AEPB, 1 puff daily., Disp: , Rfl:    bictegravir-emtricitabine -tenofovir  AF (BIKTARVY ) 50-200-25 MG TABS tablet, Take 1 tablet by mouth daily., Disp: 30 tablet, Rfl: 1   doxycycline  (VIBRA -TABS) 100 MG tablet, Take 1 tablet (100 mg total) by mouth 2 (two) times daily., Disp: 14 tablet, Rfl: 0   escitalopram (LEXAPRO) 20 MG tablet, Take 20 mg by mouth daily., Disp: , Rfl:    FLONASE ALLERGY RELIEF 50 MCG/ACT nasal spray, Place 2 sprays into both nostrils daily., Disp: , Rfl:    naloxone (NARCAN) nasal  spray 4 mg/0.1 mL, Place 1 spray into the nose once., Disp: , Rfl:    naproxen  (NAPROSYN ) 500 MG tablet, Take 1 tablet (500 mg total) by mouth 2 (two) times daily with a meal., Disp: 20 tablet, Rfl: 2    ALLERGIES   Patient has no known allergies.  LMP  (LMP Unknown)     Review of Systems: Gen:  Denies  fever, sweats, chills weight loss  HEENT: Denies blurred vision, double vision, ear pain, eye pain, hearing loss, nose bleeds, sore throat Cardiac:  No dizziness, chest pain or heaviness, chest tightness,edema, No JVD Resp:  + cough, +sputum production, +shortness of breath,+wheezing, -hemoptysis,  Other:  All other systems negative   Physical Examination:   General Appearance: No distress  EYES PERRLA, EOM intact.   NECK Supple, No JVD Pulmonary: normal breath sounds, No wheezing.  CardiovascularNormal S1,S2.  No m/r/g.   Abdomen: Benign, Soft, non-tender. Neurology UE/LE 5/5 strength, no focal deficits Ext pulses intact, cap refill intact ALL OTHER ROS ARE NEGATIVE      ASSESSMENT/PLAN   58 year old white female seen today for diagnosis of HIV and establishment of COPD  Assessment COPD Obtain pulmonary function testing Continue inhalers as prescribed with Anoro and Spiriva  Avoid Allergens and Irritants Avoid secondhand smoke Avoid SICK contacts Recommend  Masking  when appropriate Recommend Keep up-to-date with vaccinations   Smoking Assessment and Cessation Counseling Upon further questioning, Patient smokes 1 PPD I have advised patient to quit/stop smoking as soon as possible due to high risk for multiple medical problems Patient is willing to quit smoking  I have advised patient that we can assist and have options of Nicotine  replacement therapy. I also advised patient on behavioral therapy and can provide oral medication therapy in conjunction with the other therapies Follow up next Office visit  for assessment of smoking cessation Smoking cessation  counseling advised for >10  minutes   Shortness of breath and dyspnea on exertion Obtain overnight pulse oximetry Obtain pulmonary function testing Ambulating pulse oximetry in the office reveals no significant hypoxia    Tobacco abuse Follow-up lung cancer screening program    MEDICATION ADJUSTMENTS/LABS AND TESTS ORDERED: Pulmonary function testing Overnight pulse oximetry Continue inhalers as prescribed Smoking cessation Follow-up lung cancer screening program Referral to infectious diseases for HIV   CURRENT MEDICATIONS REVIEWED AT LENGTH WITH PATIENT TODAY   Patient  satisfied with Plan of action and management. All questions answered  Follow up 3 MONTHS  I spent a total of 65 minutes reviewing chart data, face-to-face evaluation with the patient, counseling and coordination of care as detailed above.     Nickolas Alm Cellar, M.D.  [facility]  Medical Director [facility] Medical Director [facility] Cardio-Pulmonary Department

## 2024-04-06 ENCOUNTER — Ambulatory Visit: Payer: Self-pay | Admitting: Internal Medicine

## 2024-04-07 ENCOUNTER — Ambulatory Visit: Payer: Self-pay | Admitting: Internal Medicine

## 2024-04-16 ENCOUNTER — Inpatient Hospital Stay: Admission: RE | Admit: 2024-04-16 | Payer: MEDICAID | Source: Ambulatory Visit

## 2024-04-17 ENCOUNTER — Other Ambulatory Visit: Payer: Self-pay | Admitting: *Deleted

## 2024-04-17 ENCOUNTER — Inpatient Hospital Stay
Admission: RE | Admit: 2024-04-17 | Discharge: 2024-04-17 | Disposition: A | Discharge status: Home / Self Care | Payer: Self-pay | Source: Ambulatory Visit | Attending: Nurse Practitioner | Admitting: Nurse Practitioner

## 2024-04-17 DIAGNOSIS — Z1231 Encounter for screening mammogram for malignant neoplasm of breast: Secondary | ICD-10-CM
